# Patient Record
Sex: Female | Born: 1980 | Race: Black or African American | Hispanic: No | Marital: Married | Smoking: Current every day smoker
Health system: Southern US, Community
[De-identification: ages and names within clinical notes are randomized; demographics above are authoritative.]

## PROBLEM LIST (undated history)

## (undated) ENCOUNTER — Emergency Department (HOSPITAL_COMMUNITY): Admission: EM | Payer: Self-pay | Source: Home / Self Care

## (undated) DIAGNOSIS — N938 Other specified abnormal uterine and vaginal bleeding: Secondary | ICD-10-CM

## (undated) DIAGNOSIS — A749 Chlamydial infection, unspecified: Secondary | ICD-10-CM

## (undated) DIAGNOSIS — E669 Obesity, unspecified: Secondary | ICD-10-CM

## (undated) DIAGNOSIS — D219 Benign neoplasm of connective and other soft tissue, unspecified: Secondary | ICD-10-CM

## (undated) DIAGNOSIS — A549 Gonococcal infection, unspecified: Secondary | ICD-10-CM

## (undated) DIAGNOSIS — Q810 Epidermolysis bullosa simplex: Secondary | ICD-10-CM

## (undated) DIAGNOSIS — Z789 Other specified health status: Secondary | ICD-10-CM

## (undated) HISTORY — DX: Epidermolysis bullosa simplex: Q81.0

## (undated) HISTORY — PX: TONSILLECTOMY: SUR1361

## (undated) HISTORY — DX: Chlamydial infection, unspecified: A74.9

## (undated) HISTORY — DX: Obesity, unspecified: E66.9

## (undated) HISTORY — DX: Benign neoplasm of connective and other soft tissue, unspecified: D21.9

## (undated) HISTORY — DX: Other specified abnormal uterine and vaginal bleeding: N93.8

## (undated) HISTORY — DX: Gonococcal infection, unspecified: A54.9

---

## 2007-05-07 ENCOUNTER — Emergency Department (HOSPITAL_COMMUNITY): Admission: EM | Admit: 2007-05-07 | Discharge: 2007-05-07 | Payer: Self-pay | Admitting: Emergency Medicine

## 2007-06-21 ENCOUNTER — Emergency Department (HOSPITAL_COMMUNITY): Admission: EM | Admit: 2007-06-21 | Discharge: 2007-06-21 | Payer: Self-pay | Admitting: Emergency Medicine

## 2007-10-23 ENCOUNTER — Emergency Department (HOSPITAL_COMMUNITY): Admission: EM | Admit: 2007-10-23 | Discharge: 2007-10-23 | Payer: Self-pay | Admitting: Emergency Medicine

## 2008-10-17 ENCOUNTER — Inpatient Hospital Stay (HOSPITAL_COMMUNITY): Admission: AD | Admit: 2008-10-17 | Discharge: 2008-10-17 | Payer: Self-pay | Admitting: Obstetrics & Gynecology

## 2009-11-05 IMAGING — CT CT PELVIS W/O CM
1 of 2 series · 15 of 32 positions shown, 19 images · non-contrast
Comparison: None.

CLINICAL DATA: Right flank and groin pain. 
 ABDOMEN CT WITHOUT CONTRAST (STONE PROTOCOL):
TECHNIQUE: Multidetector CT imaging of the abdomen was performed following the standard protocol without oral or IV contrast.
TECHNIQUE: Multidetector CT imaging of the pelvis was performed following the standard protocol without oral or IV contrast.

[Series 2: 220 stone 5.0 b40f st · axial · 0.63mm/px · z∈[-420,-44]mm · 15 of 102 slices shown, 19 images]
[im 4/102  soft-tissue]
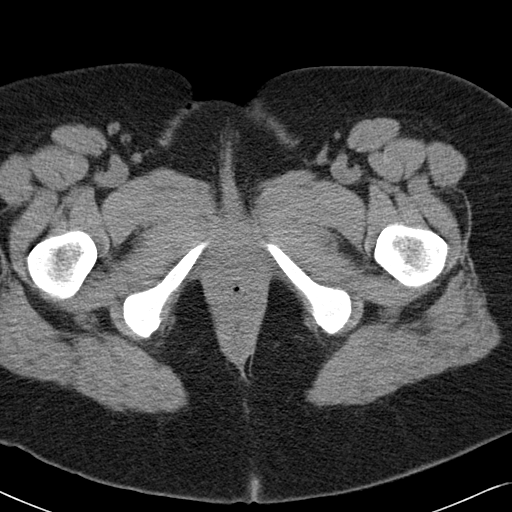
[im 4/102  bone]
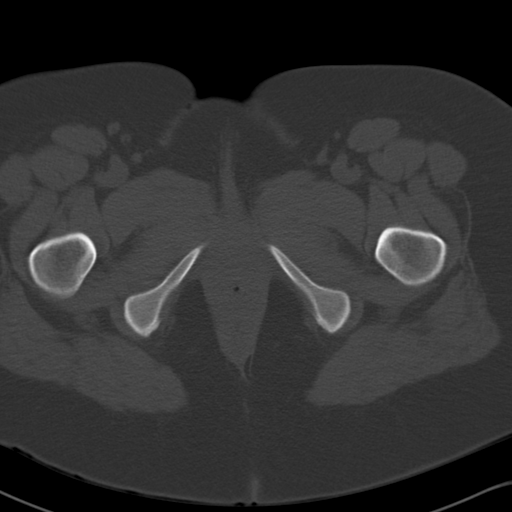
[im 12/102  soft-tissue]
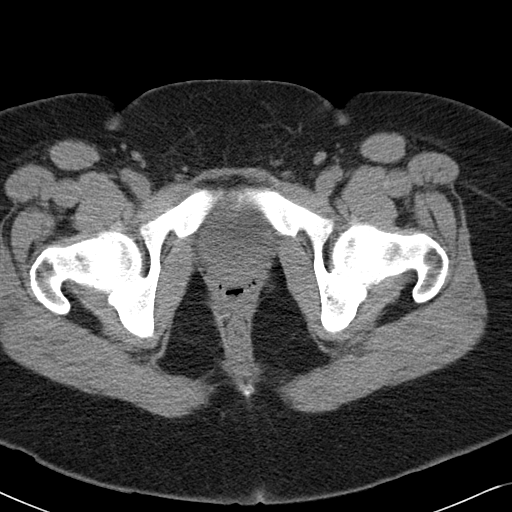
[im 20/102  soft-tissue]
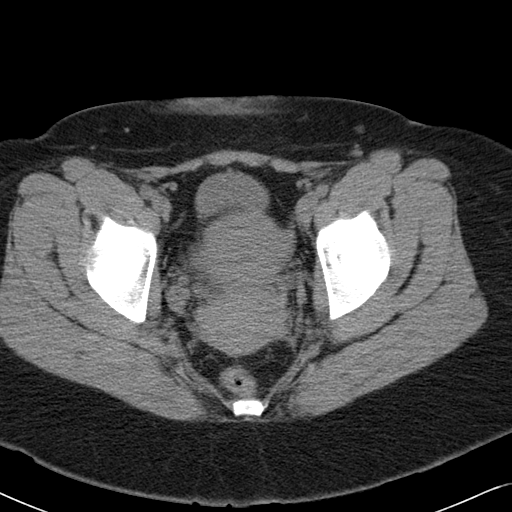
[im 28/102  soft-tissue]
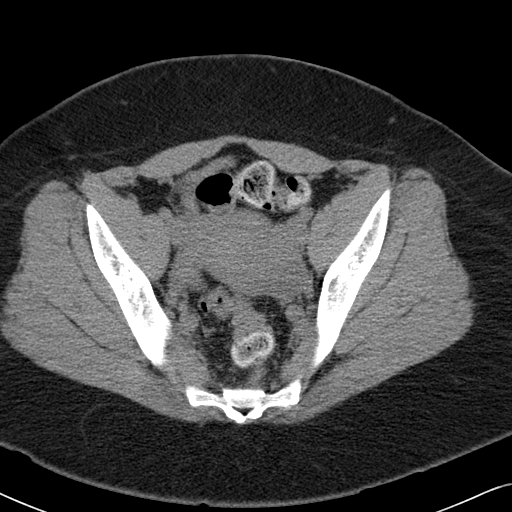
[im 35/102  soft-tissue]
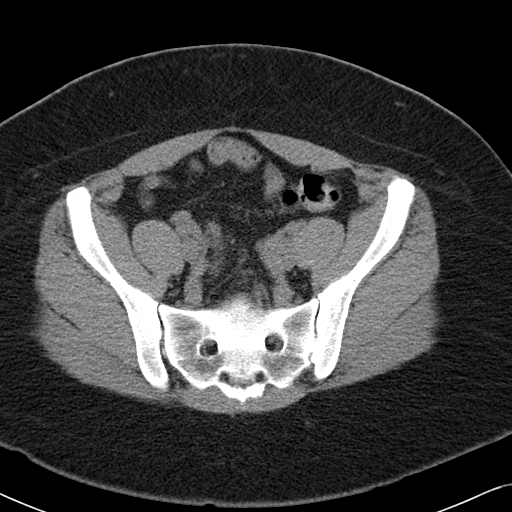
[im 43/102  soft-tissue]
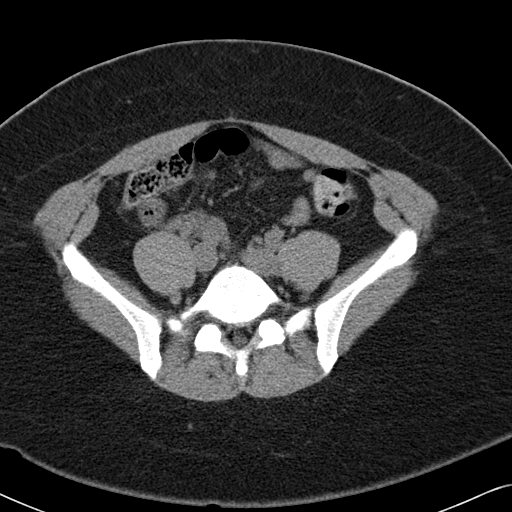
[im 51/102  soft-tissue]
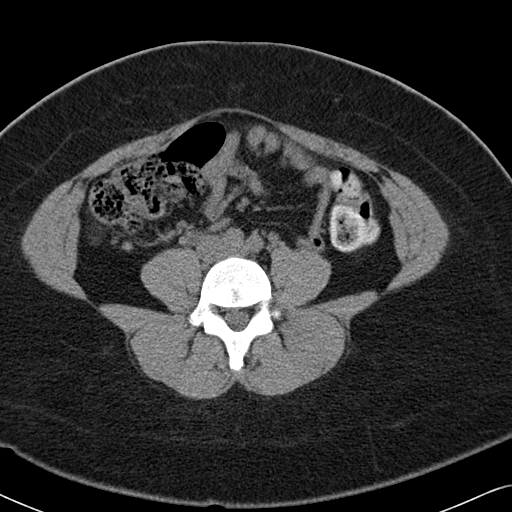
[im 59/102  soft-tissue]
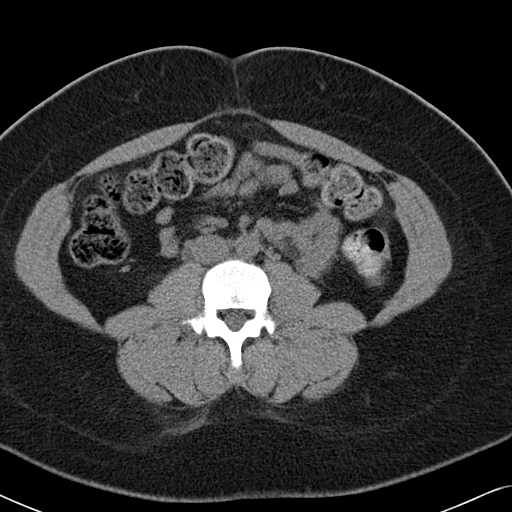
[im 67/102  soft-tissue]
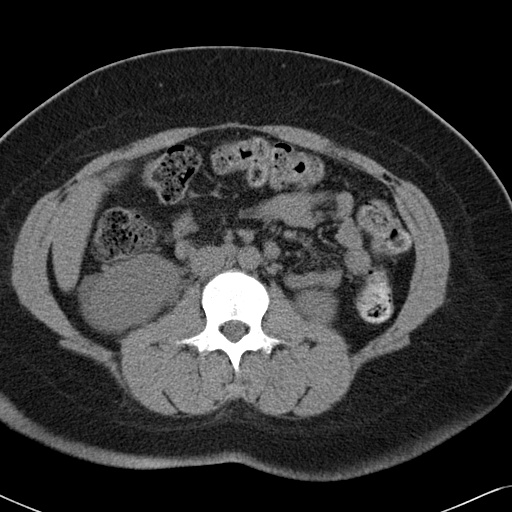
[im 67/102  bone]
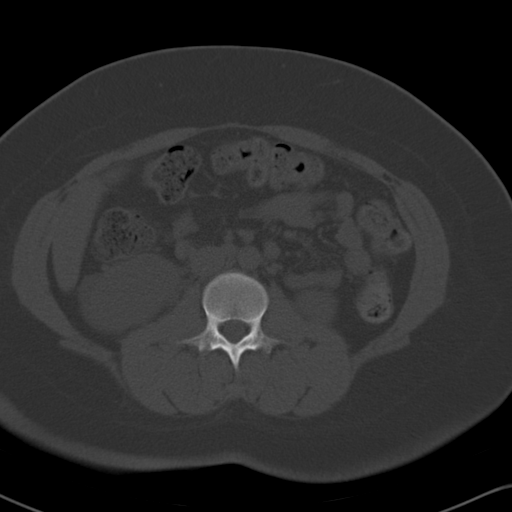
[im 74/102  soft-tissue]
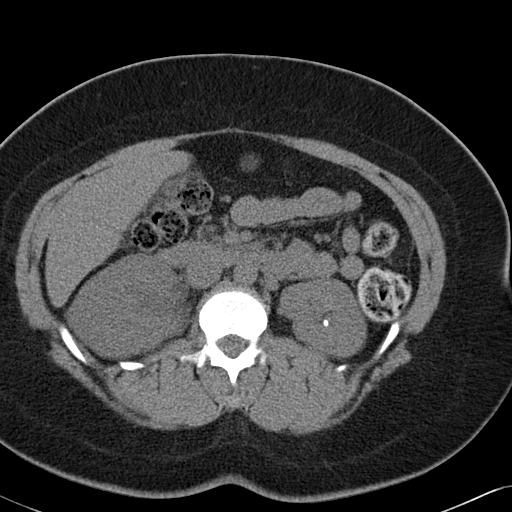
[im 82/102  soft-tissue]
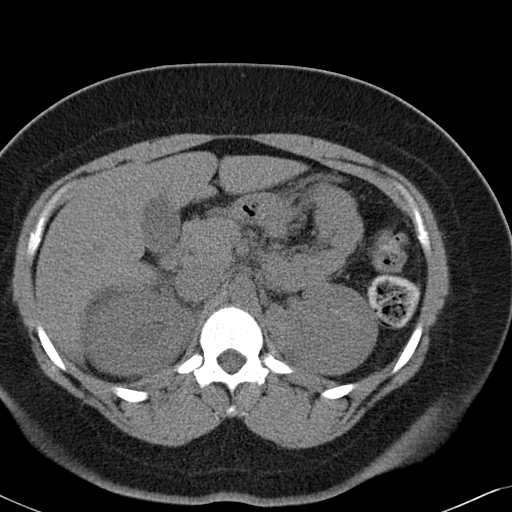
[im 86/102  lung]
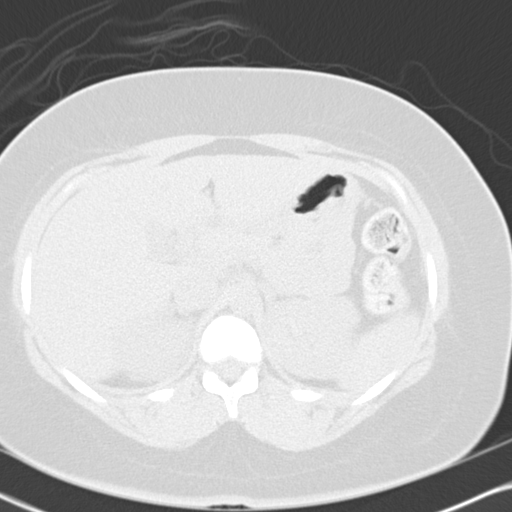
[im 90/102  soft-tissue]
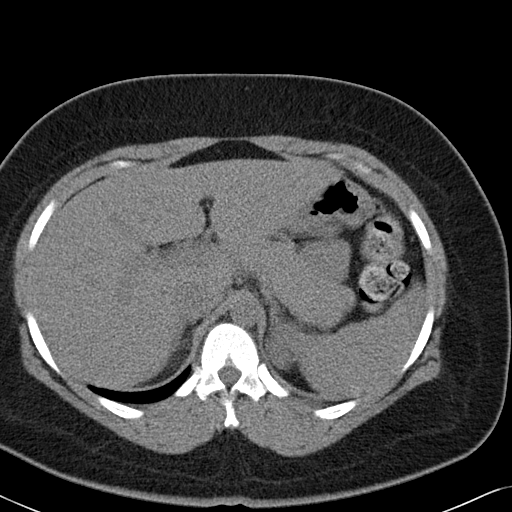
[im 90/102  lung]
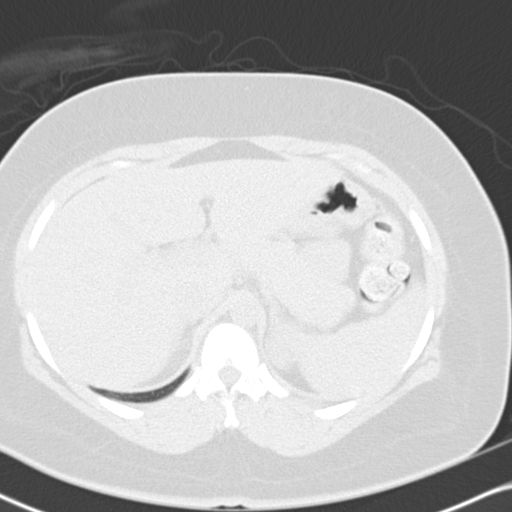
[im 94/102  lung]
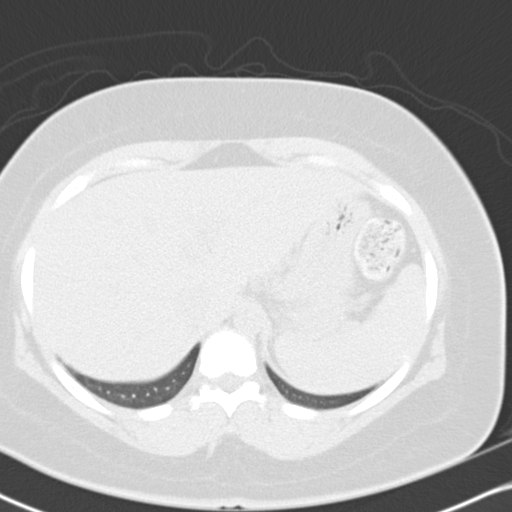
[im 98/102  soft-tissue]
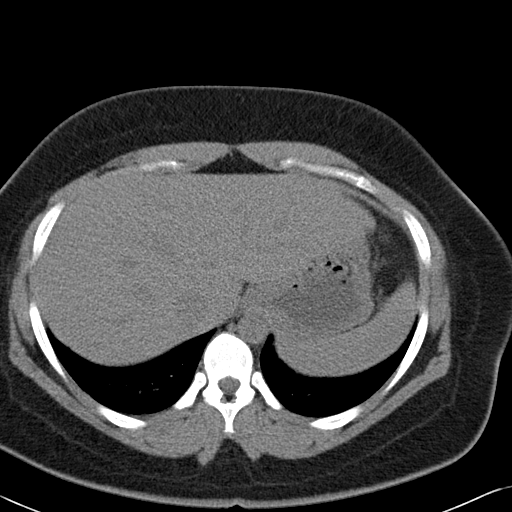
[im 98/102  lung]
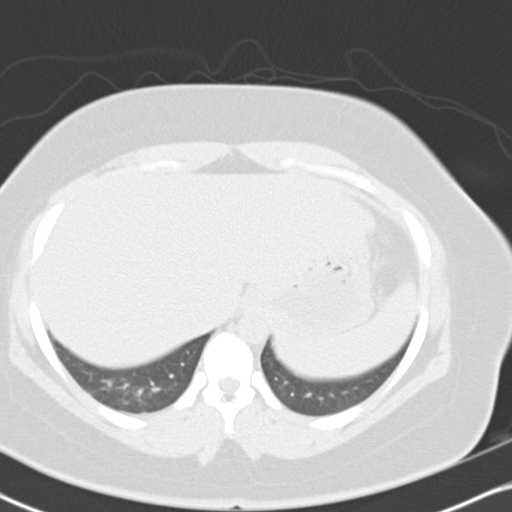

[15 of 32 positions shown; findings below may reference images not displayed]

FINDINGS: There are several left renal calculi.  The largest is in the lower pole measuring about 4 mm.  There are no definite right renal calculi, but there does appear to be some vague central renal collecting system fullness, and there is some slight stranding around the right kidney suggesting that there is an acute obstruction. 
 The right proximal ureter is mildly dilated. 
 Other viscera are unremarkable given the limitations of scanning without oral or IV contrast.  No calcified gallstones. 
 The appendix is markedly elongated, extending upward from the cecum to the level of the right mid kidney.  It is no inflamed.
IMPRESSION: 1.  Left nephrolithiasis.  
 2.  Probable acute right urinary tract obstruction. 
 PELVIS CT WITHOUT CONTRAST (STONE PROTOCOL):
FINDINGS: The right ureter is difficult to follow all the way down to the bladder.  On image #77, there is a probable 4 x 4 x 2.6 mm calculus.  It is possible that this is extra-ureteral.  If symptoms persist, one might consider obtaining a contrast-enhanced CT to document the position of the right ureter. 
 There is some calcification in the central pelvis that is in the uterus.  This is most consistent with a uterine fibroid. 
 No other findings of significance.
IMPRESSION: 1.  Probable 4 x 4 x 2.6 mm right distal ureteral calculus with mild urinary tract obstruction.  See report. 
 2.  Calcified uterine fibroid.

## 2010-02-03 ENCOUNTER — Emergency Department (HOSPITAL_COMMUNITY)
Admission: EM | Admit: 2010-02-03 | Discharge: 2010-02-04 | Payer: Self-pay | Source: Home / Self Care | Admitting: Emergency Medicine

## 2010-06-18 ENCOUNTER — Encounter: Payer: Self-pay | Admitting: Obstetrics & Gynecology

## 2010-08-09 LAB — URINALYSIS, ROUTINE W REFLEX MICROSCOPIC
Bilirubin Urine: NEGATIVE
Ketones, ur: NEGATIVE mg/dL
Protein, ur: NEGATIVE mg/dL
Urobilinogen, UA: 0.2 mg/dL (ref 0.0–1.0)

## 2010-08-09 LAB — POCT PREGNANCY, URINE: Preg Test, Ur: NEGATIVE

## 2010-08-09 LAB — URINE MICROSCOPIC-ADD ON

## 2010-09-04 LAB — CBC
HCT: 34.5 % — ABNORMAL LOW (ref 36.0–46.0)
MCHC: 33.2 g/dL (ref 30.0–36.0)
RBC: 4.15 MIL/uL (ref 3.87–5.11)
RDW: 15.7 % — ABNORMAL HIGH (ref 11.5–15.5)

## 2010-09-04 LAB — URINE CULTURE: Colony Count: 60000

## 2010-09-04 LAB — URINALYSIS, ROUTINE W REFLEX MICROSCOPIC
Specific Gravity, Urine: >1.03 — ABNORMAL HIGH (ref 1.005–1.030)
Urobilinogen, UA: 0.2 mg/dL (ref 0.0–1.0)

## 2010-09-04 LAB — URINE MICROSCOPIC-ADD ON

## 2010-11-12 ENCOUNTER — Emergency Department (HOSPITAL_COMMUNITY)
Admission: EM | Admit: 2010-11-12 | Discharge: 2010-11-12 | Disposition: A | Discharge status: Home / Self Care | Payer: Self-pay | Attending: Emergency Medicine | Admitting: Emergency Medicine

## 2010-11-12 DIAGNOSIS — R6889 Other general symptoms and signs: Secondary | ICD-10-CM | POA: Insufficient documentation

## 2010-11-12 DIAGNOSIS — R21 Rash and other nonspecific skin eruption: Secondary | ICD-10-CM | POA: Insufficient documentation

## 2010-11-12 LAB — RAPID STREP SCREEN (MED CTR MEBANE ONLY): Streptococcus, Group A Screen (Direct): NEGATIVE

## 2011-02-14 LAB — URINALYSIS, ROUTINE W REFLEX MICROSCOPIC
Bilirubin Urine: NEGATIVE
Glucose, UA: NEGATIVE
Urobilinogen, UA: 1

## 2011-03-04 LAB — URINE MICROSCOPIC-ADD ON

## 2011-03-04 LAB — URINALYSIS, ROUTINE W REFLEX MICROSCOPIC
Bilirubin Urine: NEGATIVE
Glucose, UA: NEGATIVE
Ketones, ur: NEGATIVE
Nitrite: NEGATIVE
Protein, ur: 30 — AB
Specific Gravity, Urine: 1.026
pH: 7.5

## 2011-05-18 ENCOUNTER — Encounter (HOSPITAL_COMMUNITY): Payer: Self-pay | Admitting: *Deleted

## 2011-05-18 ENCOUNTER — Inpatient Hospital Stay (HOSPITAL_COMMUNITY)
Admission: AD | Admit: 2011-05-18 | Discharge: 2011-05-18 | Disposition: A | Discharge status: Home / Self Care | Payer: Self-pay | Source: Ambulatory Visit | Attending: Obstetrics and Gynecology | Admitting: Obstetrics and Gynecology

## 2011-05-18 ENCOUNTER — Emergency Department (HOSPITAL_COMMUNITY)
Admission: EM | Admit: 2011-05-18 | Discharge: 2011-05-18 | Disposition: A | Discharge status: Home / Self Care | Payer: Self-pay | Attending: Emergency Medicine | Admitting: Emergency Medicine

## 2011-05-18 DIAGNOSIS — L0231 Cutaneous abscess of buttock: Secondary | ICD-10-CM | POA: Insufficient documentation

## 2011-05-18 DIAGNOSIS — F17200 Nicotine dependence, unspecified, uncomplicated: Secondary | ICD-10-CM | POA: Insufficient documentation

## 2011-05-18 DIAGNOSIS — K6289 Other specified diseases of anus and rectum: Secondary | ICD-10-CM | POA: Insufficient documentation

## 2011-05-18 DIAGNOSIS — K612 Anorectal abscess: Secondary | ICD-10-CM | POA: Insufficient documentation

## 2011-05-18 DIAGNOSIS — K611 Rectal abscess: Secondary | ICD-10-CM

## 2011-05-18 MED ORDER — LIDOCAINE HCL 2 % IJ SOLN
INTRAMUSCULAR | Status: AC
  Administered 2011-05-18: 14:00:00
  Filled 2011-05-18: qty 1

## 2011-05-18 MED ORDER — OXYCODONE-ACETAMINOPHEN 5-325 MG PO TABS
1.0000 | ORAL_TABLET | Freq: Once | ORAL | Status: AC
  Administered 2011-05-18: 1 via ORAL
  Filled 2011-05-18: qty 1

## 2011-05-18 MED ORDER — OXYCODONE-ACETAMINOPHEN 5-325 MG PO TABS: 1.0000 | ORAL_TABLET | ORAL | Status: AC | PRN

## 2011-05-18 NOTE — ED Notes (Signed)
PA at bedside.

## 2011-05-18 NOTE — ED Provider Notes (Signed)
History     CSN: [id number]  Arrival date & time 05/18/11  7829   None     Chief Complaint  Patient presents with  . Cyst    HPI April Wolfe is a 30 y.o. female who presents to [facility] for abscess on buttocks.  Pain and swelling began 4 days ago and patient began sitting in tubs of warm water and using warm wet compresses on the area in hopes that it would drain. However, the area has increased in size and is more painful. Tried boil-ease without relief.   History reviewed. No pertinent past medical history.  Past Surgical History  Procedure Date  . Tonsillectomy     No family history on file.  History  Substance Use Topics  . Smoking status: Current Everyday Smoker -- 0.5 packs/day  . Smokeless tobacco: Not on file  . Alcohol Use: No    OB History    Grav Para Term Preterm Abortions TAB SAB Ect Mult Living   3 2 1 1 1  0 1 0 0 2      Review of Systems  Constitutional: Negative for fever and chills.  HENT: Negative.   Eyes: Negative.   Respiratory: Negative.   Cardiovascular: Negative.   Gastrointestinal: Negative for nausea, vomiting, abdominal pain, diarrhea and constipation.  Genitourinary: Positive for frequency. Negative for dysuria and pelvic pain.  Skin:       Tender swollen area right buttock.  Psychiatric/Behavioral: Negative for confusion. The patient is not nervous/anxious.     Allergies  Review of patient's allergies indicates not on file.  Home Medications  No current outpatient prescriptions on file.  BP 126/65  Pulse 85  Temp(Src) 98.4 F (36.9 C) (Oral)  Resp 18  Ht 5\' 3"  (1.6 m)  Wt 222 lb (100.699 kg)  BMI 39.33 kg/m2  LMP 05/13/2011  Physical Exam  Nursing note and vitals reviewed. Constitutional: She is oriented to person, place, and time. She appears well-developed and well-nourished.  HENT:  Head: Normocephalic.  Eyes: EOM are normal.  Neck: Neck supple.  Cardiovascular: Normal rate.   Pulmonary/Chest: Effort normal.    Abdominal: Soft. There is no tenderness.  Genitourinary:       Tender, raised red area right buttock extending to the rectal sphincter.   Musculoskeletal: Normal range of motion.  Neurological: She is alert and oriented to person, place, and time. No cranial nerve deficit.  Skin: Skin is warm and dry.  Psychiatric: She has a normal mood and affect. Her behavior is normal. Judgment and thought content normal.   Assessment: Perirectal abscess  Plan:  Consult with Dr. Biagio Quint and he will evaluate the patient in the [facility] ED.   Patient will be discharged from [facility] and drive herself to [facility]  ED Course  Procedures   MDM          Kerrie Buffalo, NP 05/18/11 [phone]

## 2011-05-18 NOTE — Progress Notes (Signed)
Pt has boil on her  Right rectal peri area Started 4-5 days ago. Has been soaking in warm water with out releif.

## 2011-05-18 NOTE — ED Notes (Signed)
Rectal abscess for about 4-5 days, red tender and painful

## 2011-05-18 NOTE — ED Provider Notes (Signed)
History     CSN: [id number]  Arrival date & time 05/18/11  1045   First MD Initiated Contact with Patient 05/18/11 1147      Chief Complaint  Patient presents with  . Rectal Pain    (Consider location/radiation/quality/duration/timing/severity/associated sxs/prior treatment) Patient is a 30 y.o. female presenting with abscess. The history is provided by the patient.  Abscess  The problem occurs continuously. The problem has been gradually worsening. Affected Location: Location is on right buttock adjacent to anus. The problem is moderate. The abscess is characterized by painfulness and swelling. Pertinent negatives include no fever. Associated symptoms comments: She attempted a bowel movement earlier today that was too painful to complete.Marland Kitchen    History reviewed. No pertinent past medical history.  Past Surgical History  Procedure Date  . Tonsillectomy     No family history on file.  History  Substance Use Topics  . Smoking status: Current Everyday Smoker -- 0.5 packs/day  . Smokeless tobacco: Not on file  . Alcohol Use: No    OB History    Grav Para Term Preterm Abortions TAB SAB Ect Mult Living   3 2 1 1 1  0 1 0 0 2      Review of Systems  Constitutional: Negative for fever and chills.  HENT: Negative.   Respiratory: Negative.   Cardiovascular: Negative.   Gastrointestinal: Negative.  Negative for nausea.  Musculoskeletal: Negative.   Skin:       C/O Abscess.  Neurological: Negative.     Allergies  Review of patient's allergies indicates no known allergies.  Home Medications  No current outpatient prescriptions on file.  BP 126/83  Pulse 84  Temp(Src) 97.6 F (36.4 C) (Oral)  Resp 18  SpO2 98%  LMP 05/13/2011  Physical Exam  Constitutional: She is oriented to person, place, and time. She appears well-developed and well-nourished.  Neck: Normal range of motion.  Pulmonary/Chest: Effort normal.  Genitourinary:       2cmx2cm fluctuant area of  induration inferior to anus. No active drainage.   Neurological: She is alert and oriented to person, place, and time.  Skin: Skin is warm and dry.    ED Course  Procedures (including critical care time)  Labs Reviewed - No data to display No results found.   No diagnosis found.    MDM   INCISION AND DRAINAGE Performed by: Langley Adie A Consent: Verbal consent obtained. Risks and benefits: risks, benefits and alternatives were discussed Type: abscess  Body area: perirectal  Anesthesia: local infiltration  Local anesthetic: lidocaine 1% w/o epinephrine  Anesthetic total: 2 ml  Complexity: complex Blunt dissection to break up loculations  Drainage: purulent  Drainage amount: moderate  Packing material: 1/4 in iodoform gauze  Patient tolerance: Patient tolerated the procedure well with no immediate complications.     Rodena Medin, PA 05/18/11 [phone]

## 2011-05-19 NOTE — ED Provider Notes (Signed)
Agree with above note.  April Wolfe 05/19/2011 7:41 AM

## 2011-05-19 NOTE — ED Provider Notes (Signed)
Medical screening examination/treatment/procedure(s) were conducted as a shared visit with non-physician practitioner(s) and myself.  I personally evaluated the patient during the encounter  2x2cm Perirectal Abscess s/p I&D. Will f/u in 2 days for recheck  Forbes Cellar, MD 05/19/11 [phone]

## 2011-05-20 ENCOUNTER — Encounter (HOSPITAL_COMMUNITY): Payer: Self-pay | Admitting: Family Medicine

## 2011-05-20 ENCOUNTER — Emergency Department (HOSPITAL_COMMUNITY)
Admission: EM | Admit: 2011-05-20 | Discharge: 2011-05-20 | Disposition: A | Discharge status: Home / Self Care | Payer: Self-pay | Attending: Emergency Medicine | Admitting: Emergency Medicine

## 2011-05-20 DIAGNOSIS — L02212 Cutaneous abscess of back [any part, except buttock]: Secondary | ICD-10-CM

## 2011-05-20 DIAGNOSIS — L02219 Cutaneous abscess of trunk, unspecified: Secondary | ICD-10-CM | POA: Insufficient documentation

## 2011-05-20 DIAGNOSIS — Z09 Encounter for follow-up examination after completed treatment for conditions other than malignant neoplasm: Secondary | ICD-10-CM | POA: Insufficient documentation

## 2011-05-20 DIAGNOSIS — L03319 Cellulitis of trunk, unspecified: Secondary | ICD-10-CM | POA: Insufficient documentation

## 2011-05-20 DIAGNOSIS — F17200 Nicotine dependence, unspecified, uncomplicated: Secondary | ICD-10-CM | POA: Insufficient documentation

## 2011-05-20 MED ORDER — DOXYCYCLINE HYCLATE 100 MG PO CAPS: 100.0000 mg | ORAL_CAPSULE | Freq: Two times a day (BID) | ORAL | Status: AC

## 2011-05-20 NOTE — ED Notes (Signed)
Pt reports she had a boil lanced and packed here on x3 days ago located on buttocks. States she was told to come back for a re-check.

## 2011-05-20 NOTE — ED Provider Notes (Signed)
History     CSN: [id number]  Arrival date & time 05/20/11  1745   First MD Initiated Contact with Patient 05/20/11 1826      Chief Complaint  Patient presents with  . Abscess    follow up    (Consider location/radiation/quality/duration/timing/severity/associated sxs/prior treatment) Patient is a 30 y.o. female presenting with abscess. The history is provided by the patient.  Abscess  This is a new problem. The current episode started less than one week ago. The onset was sudden. The problem has been gradually improving. The abscess is present on the right buttock. The problem is mild. The abscess is characterized by painfulness, draining and swelling. Pertinent negatives include no fever.  Pt with right buttocks abscess near the rectum. Had it drained 2 days ago, here for recheck. States pain is improving, abscess still draining. No fever, chills, malaise.   History reviewed. No pertinent past medical history.  Past Surgical History  Procedure Date  . Tonsillectomy     History reviewed. No pertinent family history.  History  Substance Use Topics  . Smoking status: Current Everyday Smoker -- 0.5 packs/day  . Smokeless tobacco: Not on file  . Alcohol Use: No    OB History    Grav Para Term Preterm Abortions TAB SAB Ect Mult Living   3 2 1 1 1  0 1 0 0 2      Review of Systems  Constitutional: Negative for fever and chills.  HENT: Negative.   Eyes: Negative.   Respiratory: Negative.   Cardiovascular: Negative.   Gastrointestinal: Negative.   Musculoskeletal: Negative.   Skin:       abscess  Neurological: Negative.   Psychiatric/Behavioral: Negative.     Allergies  Review of patient's allergies indicates no known allergies.  Home Medications   Current Outpatient Rx  Name Route Sig Dispense Refill  . OXYCODONE-ACETAMINOPHEN 5-325 MG PO TABS Oral Take 1 tablet by mouth every 4 (four) hours as needed for pain. 15 tablet 0    BP 133/78  Pulse 78   Temp(Src) 99.2 F (37.3 C) (Oral)  Resp 18  SpO2 98%  LMP 05/13/2011  Physical Exam  Constitutional: She is oriented to person, place, and time. She appears well-developed and well-nourished.  HENT:  Head: Normocephalic.  Eyes: Pupils are equal, round, and reactive to light.  Neck: Neck supple.  Cardiovascular: Normal rate, regular rhythm and normal heart sounds.   Pulmonary/Chest: Effort normal and breath sounds normal. No respiratory distress.  Neurological: She is alert and oriented to person, place, and time.  Skin: Skin is warm and dry.       I&ded abscess tot he left pararectal area. Draining. Malodorous. Packing out. Tender to palpation. No induration palpated.  Psychiatric: She has a normal mood and affect.    ED Course  Procedures (including critical care time)  Abscess rechecked. Now soft. Packing is out on its own. Will start on antibiotics. D/c home with sitz baths, and follow up.  MDM          Lottie Mussel, PA 05/20/11 [phone]

## 2011-05-20 NOTE — ED Provider Notes (Signed)
Medical screening examination/treatment/procedure(s) were performed by non-physician practitioner and as supervising physician I was immediately available for consultation/collaboration.   Dayton Bailiff, MD 05/20/11 [phone]

## 2011-07-31 ENCOUNTER — Inpatient Hospital Stay (HOSPITAL_COMMUNITY): Payer: Self-pay

## 2011-07-31 ENCOUNTER — Inpatient Hospital Stay (HOSPITAL_COMMUNITY)
Admission: AD | Admit: 2011-07-31 | Discharge: 2011-07-31 | Disposition: A | Discharge status: Home / Self Care | Payer: Self-pay | Source: Ambulatory Visit | Attending: Obstetrics & Gynecology | Admitting: Obstetrics & Gynecology

## 2011-07-31 ENCOUNTER — Encounter (HOSPITAL_COMMUNITY): Payer: Self-pay | Admitting: *Deleted

## 2011-07-31 DIAGNOSIS — N949 Unspecified condition associated with female genital organs and menstrual cycle: Secondary | ICD-10-CM | POA: Insufficient documentation

## 2011-07-31 DIAGNOSIS — D259 Leiomyoma of uterus, unspecified: Secondary | ICD-10-CM | POA: Insufficient documentation

## 2011-07-31 DIAGNOSIS — R1032 Left lower quadrant pain: Secondary | ICD-10-CM | POA: Insufficient documentation

## 2011-07-31 HISTORY — DX: Other specified health status: Z78.9

## 2011-07-31 LAB — COMPREHENSIVE METABOLIC PANEL
AST: 12 U/L (ref 0–37)
Albumin: 3.5 g/dL (ref 3.5–5.2)
BUN: 11 mg/dL (ref 6–23)
Calcium: 9.7 mg/dL (ref 8.4–10.5)
Creatinine, Ser: 0.8 mg/dL (ref 0.50–1.10)

## 2011-07-31 LAB — CBC
MCH: 22.8 pg — ABNORMAL LOW (ref 26.0–34.0)
MCHC: 29.9 g/dL — ABNORMAL LOW (ref 30.0–36.0)
MCV: 76.5 fL — ABNORMAL LOW (ref 78.0–100.0)
Platelets: 569 10*3/uL — ABNORMAL HIGH (ref 150–400)
RBC: 4.29 MIL/uL (ref 3.87–5.11)
RDW: 16 % — ABNORMAL HIGH (ref 11.5–15.5)

## 2011-07-31 LAB — HCG, SERUM, QUALITATIVE: Preg, Serum: NEGATIVE

## 2011-07-31 MED ORDER — OXYCODONE-ACETAMINOPHEN 5-325 MG PO TABS: 1.0000 | ORAL_TABLET | ORAL | Status: AC | PRN

## 2011-07-31 MED ORDER — KETOROLAC TROMETHAMINE 30 MG/ML IJ SOLN
60.0000 mg | Freq: Once | INTRAMUSCULAR | Status: AC
  Administered 2011-07-31: 60 mg via INTRAVENOUS

## 2011-07-31 MED ORDER — LACTATED RINGERS IV BOLUS (SEPSIS)
1000.0000 mL | Freq: Once | INTRAVENOUS | Status: AC
  Administered 2011-07-31: 1000 mL via INTRAVENOUS

## 2011-07-31 MED ORDER — PROMETHAZINE HCL 25 MG/ML IJ SOLN
25.0000 mg | Freq: Four times a day (QID) | INTRAMUSCULAR | Status: DC | PRN
  Filled 2011-07-31: qty 1

## 2011-07-31 MED ORDER — NAPROXEN 375 MG PO TABS: 375.0000 mg | ORAL_TABLET | Freq: Two times a day (BID) | ORAL | Status: DC

## 2011-07-31 MED ORDER — KETOROLAC TROMETHAMINE 60 MG/2ML IM SOLN
60.0000 mg | Freq: Once | INTRAMUSCULAR | Status: DC
  Filled 2011-07-31: qty 2

## 2011-07-31 NOTE — Discharge Instructions (Signed)
Fibroids You have been diagnosed as having a fibroid. Fibroids are smooth muscle lumps (tumors) which can occur any place in a woman's body. They are usually in the womb (uterus). The most common problem (symptom) of fibroids is bleeding. Over time this may cause low red blood cells (anemia). Other symptoms include feelings of pressure and pain in the pelvis. The diagnosis (learning what is wrong) of fibroids is made by physical exam. Sometimes tests such as an ultrasound are used. This is helpful when fibroids are felt around the ovaries and to look for tumors. TREATMENT   Most fibroids do not need surgical or medical treatment. Sometimes a tissue sample (biopsy) of the lining of the uterus is done to rule out cancer. If there is no cancer and only a small amount of bleeding, the problem can be watched.   Hormonal treatment can improve the problem.   When surgery is needed, it can consist of removing the fibroid. Vaginal birth may not be possible after the removal of fibroids. This depends on where they are and the extent of surgery. When pregnancy occurs with fibroids it is usually normal.   Your caregiver can help decide which treatments are best for you.  HOME CARE INSTRUCTIONS   Do not use aspirin as this may increase bleeding problems.   If your periods (menses) are heavy, record the number of pads or tampons used per month. Bring this information to your caregiver. This can help them determine the best treatment for you.  SEEK IMMEDIATE MEDICAL CARE IF:  You have pelvic pain or cramps not controlled with medications, or experience a sudden increase in pain.   You have an increase of pelvic bleeding between and during menses.   You feel lightheaded or have fainting spells.   You develop worsening belly (abdominal) pain.  Document Released: 05/10/2000 Document Revised: 05/02/2011 Document Reviewed: 12/31/2007 [employer] Patient Information 2012 [employer].    

## 2011-07-31 NOTE — ED Provider Notes (Signed)
History     Chief Complaint  Patient presents with  . Pelvic Pain   HPI 31 yo F presenting for acute onset severe left lower quadrant abdominal pain. Patient starting having pain after 2:00pm in lower left pelvic radiates to back. Never had anything like this before. Sharp pains, 10/10. Position makes it better. Bumps in road made it worse.  LMP early February. No contraception.  No bleeding, no vaginal discharge, no pain with sex, no dysuria.  Normally gets period every 28-30 days, heavy bleeding. Painful cramps but this pain does not feel like normal cramps. G3, [id number]. Miscarriage at 4 months. 2nd living child at 32 weeks. Both vaginal births.  OB History    Grav Para Term Preterm Abortions TAB SAB Ect Mult Living   3 2 1 1 1  0 1 0 0 2      Past Medical History  Diagnosis Date  . No pertinent past medical history     Past Surgical History  Procedure Date  . Tonsillectomy     Family History  Problem Relation Age of Onset  . Hypertension Father     History  Substance Use Topics  . Smoking status: Current Everyday Smoker -- 0.5 packs/day  . Smokeless tobacco: Not on file  . Alcohol Use: 0.5 oz/week    1 drink(s) per week    Allergies: No Known Allergies  No prescriptions prior to admission    Review of Systems  Constitutional: Negative for fever.  Respiratory: Negative for shortness of breath.   Cardiovascular: Negative for chest pain.  Gastrointestinal: Positive for nausea, vomiting and abdominal pain.  Genitourinary: Negative for dysuria.  Neurological: Negative for headaches.   Physical Exam   Blood pressure 126/75, pulse 81, temperature 98.3 F (36.8 C), temperature source Oral, resp. rate 18, SpO2 98.00%.  Physical Exam  Constitutional: She is oriented to person, place, and time. She appears well-developed and well-nourished. She appears distressed.  HENT:  Head: Normocephalic and atraumatic.  Neck: Normal range of motion.  Cardiovascular: Normal  rate, regular rhythm and normal heart sounds.   Respiratory: Effort normal and breath sounds normal.  GI: There is tenderness. There is rebound.       Obese. No masses palpated. Focal tenderness in distal left lower quadrant. Patient does have rebound tenderness present.   Musculoskeletal: Normal range of motion. She exhibits no edema.  Lymphadenopathy:    She has no cervical adenopathy.  Neurological: She is alert and oriented to person, place, and time.    [facility] Course  Procedures Results for orders placed during the hospital encounter of 07/31/11 (from the past 24 hour(s))  CBC     Status: Abnormal   Collection Time   07/31/11  7:13 PM      Component Value Range   WBC 12.9 (*) 4.0 - 10.5 (K/uL)   RBC 4.29  3.87 - 5.11 (MIL/uL)   Hemoglobin 9.8 (*) 12.0 - 15.0 (g/dL)   HCT 16.1 (*) 09.6 - 46.0 (%)   MCV 76.5 (*) 78.0 - 100.0 (fL)   MCH 22.8 (*) 26.0 - 34.0 (pg)   MCHC 29.9 (*) 30.0 - 36.0 (g/dL)   RDW 04.5 (*) 40.9 - 15.5 (%)   Platelets 569 (*) 150 - 400 (K/uL)  HCG, SERUM, QUALITATIVE     Status: Normal   Collection Time   07/31/11  7:13 PM      Component Value Range   Preg, Serum NEGATIVE  NEGATIVE   COMPREHENSIVE METABOLIC PANEL  Status: Abnormal   Collection Time   07/31/11  7:13 PM      Component Value Range   Sodium 136  135 - 145 (mEq/L)   Potassium 3.9  3.5 - 5.1 (mEq/L)   Chloride 102  96 - 112 (mEq/L)   CO2 23  19 - 32 (mEq/L)   Glucose, Bld 104 (*) 70 - 99 (mg/dL)   BUN 11  6 - 23 (mg/dL)   Creatinine, Ser 1.61  0.50 - 1.10 (mg/dL)   Calcium 9.7  8.4 - 09.6 (mg/dL)   Total Protein 7.4  6.0 - 8.3 (g/dL)   Albumin 3.5  3.5 - 5.2 (g/dL)   AST 12  0 - 37 (U/L)   ALT 14  0 - 35 (U/L)   Alkaline Phosphatase 104  39 - 117 (U/L)   Total Bilirubin 0.1 (*) 0.3 - 1.2 (mg/dL)   GFR calc non Af Amer >90  >90 (mL/min)   GFR calc Af Amer >90  >90 (mL/min)    MDM Patient clearly uncomfortable. Will start LR bolus, check upreg/serum qualitative BHCG, CBC, Cmet, non-OB  ultrasound. Will give Toradol 60mg  x1. Reassess pain. Plan discussed with Georges Mouse, CNM  Assessment and Plan  31 yo F presenting for acute onset severe left lower quadrant abdominal pain - Pregnancy test negative - US showed multiple fibroids, largest measuring 9.3x7.3x9.1cm. This is the most likely cause of her pain.  - Toradol helped her pain and patient appeared much more comfortable at time of discharge. Will give Rx for Percocet and Naproxen for pain - Patient will follow up in GYN clinic. Informed to call the clinic to make appointment if she does not hear from them within the next few weeks. She agrees. - Discharged home in stable medical condition.  [place], AMBER 07/31/2011, 7:58 PM

## 2011-07-31 NOTE — Progress Notes (Signed)
Pt states she started having pain in her lower left sided pelvic area  Today at about 1400. Pt states pain progressively became worse.

## 2011-08-01 ENCOUNTER — Encounter: Payer: Self-pay | Admitting: *Deleted

## 2011-08-20 ENCOUNTER — Encounter: Payer: Self-pay | Admitting: *Deleted

## 2011-08-23 ENCOUNTER — Encounter: Payer: Self-pay | Admitting: Obstetrics & Gynecology

## 2011-08-30 ENCOUNTER — Encounter: Payer: Self-pay | Admitting: Obstetrics & Gynecology

## 2011-10-03 ENCOUNTER — Ambulatory Visit (INDEPENDENT_AMBULATORY_CARE_PROVIDER_SITE_OTHER): Payer: Self-pay | Admitting: Obstetrics & Gynecology

## 2011-10-03 ENCOUNTER — Encounter: Payer: Self-pay | Admitting: Obstetrics & Gynecology

## 2011-10-03 VITALS — BP 123/83 | HR 84 | Temp 97.0°F | Ht 64.0 in | Wt 222.1 lb

## 2011-10-03 DIAGNOSIS — D259 Leiomyoma of uterus, unspecified: Secondary | ICD-10-CM | POA: Insufficient documentation

## 2011-10-03 DIAGNOSIS — Z01419 Encounter for gynecological examination (general) (routine) without abnormal findings: Secondary | ICD-10-CM

## 2011-10-03 MED ORDER — LEUPROLIDE ACETATE (3 MONTH) 11.25 MG IM KIT: 11.2500 mg | PACK | Freq: Once | INTRAMUSCULAR | Status: DC

## 2011-10-03 NOTE — Patient Instructions (Signed)
Fibroids You have been diagnosed as having a fibroid. Fibroids are smooth muscle lumps (tumors) which can occur any place in a woman's body. They are usually in the womb (uterus). The most common problem (symptom) of fibroids is bleeding. Over time this may cause low red blood cells (anemia). Other symptoms include feelings of pressure and pain in the pelvis. The diagnosis (learning what is wrong) of fibroids is made by physical exam. Sometimes tests such as an ultrasound are used. This is helpful when fibroids are felt around the ovaries and to look for tumors. TREATMENT   Most fibroids do not need surgical or medical treatment. Sometimes a tissue sample (biopsy) of the lining of the uterus is done to rule out cancer. If there is no cancer and only a small amount of bleeding, the problem can be watched.   Hormonal treatment can improve the problem.   When surgery is needed, it can consist of removing the fibroid. Vaginal birth may not be possible after the removal of fibroids. This depends on where they are and the extent of surgery. When pregnancy occurs with fibroids it is usually normal.   Your caregiver can help decide which treatments are best for you.  HOME CARE INSTRUCTIONS   Do not use aspirin as this may increase bleeding problems.   If your periods (menses) are heavy, record the number of pads or tampons used per month. Bring this information to your caregiver. This can help them determine the best treatment for you.  SEEK IMMEDIATE MEDICAL CARE IF:  You have pelvic pain or cramps not controlled with medications, or experience a sudden increase in pain.   You have an increase of pelvic bleeding between and during menses.   You feel lightheaded or have fainting spells.   You develop worsening belly (abdominal) pain.  Document Released: 05/10/2000 Document Revised: 05/02/2011 Document Reviewed: 12/31/2007 [employer] Patient Information 2012 [employer].    

## 2011-10-03 NOTE — Progress Notes (Signed)
Subjective:    Patient ID: April Wolfe, female    DOB: 22-Sep-1980, 31 y.o.   MRN: [id number]  HPIPatient's last menstrual period was 09/30/2011. [id number] Heavy periods last about a week, and she has been missing work because of heavy flow and dysmenorrhea.She would prefer to maintain fertility.  Past Medical History  Diagnosis Date  . No pertinent past medical history   . Fibroids   . DUB (dysfunctional uterine bleeding)    Past Surgical History  Procedure Date  . Tonsillectomy    Current outpatient prescriptions:naproxen (NAPROSYN) 375 MG tablet, Take 1 tablet (375 mg total) by mouth 2 (two) times daily with a meal., Disp: 45 tablet, Rfl: 0 No Known Allergies History   Social History  . Marital Status: Single    Spouse Name: N/A    Number of Children: N/A  . Years of Education: N/A   Occupational History  . Not on file.   Social History Main Topics  . Smoking status: Current Everyday Smoker -- 0.5 packs/day  . Smokeless tobacco: Not on file  . Alcohol Use: 0.5 oz/week    1 drink(s) per week  . Drug Use: No  . Sexually Active: Yes    Birth Control/ Protection: None   Other Topics Concern  . Not on file   Social History Narrative  . No narrative on file   Family History  Problem Relation Age of Onset  . Hypertension Father    CBC    Component Value Date/Time   WBC 12.9* 07/31/2011 1913   RBC 4.29 07/31/2011 1913   HGB 9.8* 07/31/2011 1913   HCT 32.8* 07/31/2011 1913   PLT 569* 07/31/2011 1913   MCV 76.5* 07/31/2011 1913   MCH 22.8* 07/31/2011 1913   MCHC 29.9* 07/31/2011 1913   RDW 16.0* 07/31/2011 1913       Review of Systems     Objective:   Physical Exam Filed Vitals:   10/03/11 1431  BP: 123/83  Pulse: 84  Temp: 97 F (36.1 C)  TempSrc: Oral  Height: 5\' 4"  (1.626 m)  Weight: 222 lb 1.6 oz (100.744 kg)   NAD, pleasant Abd: obese, no mass,mildly tender low quadrants Pelvic: mod blood, cervix posterior, pap done, uterus 10 weeks size, no masses    *RADIOLOGY REPORT*  Clinical Data: Left pelvic pain, irregular bleeding  TRANSABDOMINAL AND TRANSVAGINAL ULTRASOUND OF PELVIS  Technique: Both transabdominal and transvaginal ultrasound  examinations of the pelvis were performed. Transabdominal technique  was performed for global imaging of the pelvis including uterus,  ovaries, adnexal regions, and pelvic cul-de-sac.  Comparison: Oct 17, 2008  It was necessary to proceed with endovaginal exam following the  transabdominal exam to visualize the endometrium and adnexa.  Findings:  The uterus is enlarged and lobular in contour, measuring 13.5 x 8.9  x 9.5 cm. Multiple myometrial fibroids are again noted. The  largest is at the posterior fundus, measuring 9.3 cm ( previously  4.3 cm). The endometrial stripe is thin and homogeneous, measuring  5 mm.  Both ovaries have a normal size and appearance. The right ovary  measures 3.2 x 2.2 x 1.9 cm, and the left ovary measures 3.5 x 2.3  x 2.2 cm. There are no adnexal masses or free pelvic fluid.  IMPRESSION: Fibroid uterus. The largest fibroid is myometrial  posterior fundal, measuring 9.3 cm. This has at least doubled in  size since the prior study.  Normal endometrial stripe and ovaries.  Original Report Authenticated By: Karl Ito  Kearney Hard, M.D.         Assessment & Plan:  Fibroid uterus, menorrhagia and dysmenorrhea. Interested in myomectomy. Will get Lupron Depot 11.5 mg, RTC 6 weeks with Dr. Marice Potter to discuss surgical option  April Wolfe 10/03/2011 3:28 PM

## 2011-10-31 ENCOUNTER — Ambulatory Visit (INDEPENDENT_AMBULATORY_CARE_PROVIDER_SITE_OTHER): Payer: Self-pay

## 2011-11-04 ENCOUNTER — Ambulatory Visit (INDEPENDENT_AMBULATORY_CARE_PROVIDER_SITE_OTHER): Payer: Self-pay

## 2011-11-04 VITALS — BP 119/79 | HR 83 | Temp 98.0°F | Ht 63.0 in | Wt 218.2 lb

## 2011-11-04 DIAGNOSIS — N925 Other specified irregular menstruation: Secondary | ICD-10-CM

## 2011-11-04 DIAGNOSIS — N938 Other specified abnormal uterine and vaginal bleeding: Secondary | ICD-10-CM

## 2011-11-04 DIAGNOSIS — N949 Unspecified condition associated with female genital organs and menstrual cycle: Secondary | ICD-10-CM

## 2011-11-04 MED ORDER — LEUPROLIDE ACETATE (3 MONTH) 11.25 MG IM KIT
11.2500 mg | PACK | Freq: Once | INTRAMUSCULAR | Status: AC
  Administered 2011-11-04: 11.25 mg via INTRAMUSCULAR

## 2011-11-05 ENCOUNTER — Ambulatory Visit: Payer: Self-pay

## 2011-11-18 ENCOUNTER — Ambulatory Visit (INDEPENDENT_AMBULATORY_CARE_PROVIDER_SITE_OTHER): Payer: Self-pay | Admitting: Obstetrics & Gynecology

## 2011-11-18 ENCOUNTER — Encounter: Payer: Self-pay | Admitting: Obstetrics & Gynecology

## 2011-11-18 VITALS — BP 123/80 | HR 88 | Temp 97.9°F | Ht 63.0 in | Wt 220.0 lb

## 2011-11-18 DIAGNOSIS — D219 Benign neoplasm of connective and other soft tissue, unspecified: Secondary | ICD-10-CM

## 2011-11-18 DIAGNOSIS — D259 Leiomyoma of uterus, unspecified: Secondary | ICD-10-CM

## 2011-11-18 LAB — POCT PREGNANCY, URINE: Preg Test, Ur: NEGATIVE

## 2011-11-18 NOTE — Progress Notes (Signed)
  Subjective:    Patient ID: April Wolfe, female    DOB: 07/04/1980, 31 y.o.   MRN: [id number]  HPI  31 yo S AA  [id number] (eab and sab) with large fibroids and menorrhagia resulting in anemia. She wants to preserve her fertility. She got one depo Lupron shot 5/13. Review of Systems    monogamous for 20 years Works at [employer] Denies dysparunia Objective:   Physical Exam        Assessment & Plan:  Symptomatic fibroids, anemia- I have offered her a RATH versus an open myomectomy. She wants the myomectomy. She understands the risks of surgery.

## 2011-11-19 LAB — GC/CHLAMYDIA PROBE AMP, URINE
Chlamydia, Swab/Urine, PCR: NEGATIVE
GC Probe Amp, Urine: NEGATIVE

## 2011-11-20 ENCOUNTER — Telehealth: Payer: Self-pay | Admitting: Obstetrics and Gynecology

## 2011-11-20 NOTE — Telephone Encounter (Signed)
Patient called with concern of spotting after depo lupron for menorrhagia and fibroids. Advised patient that she could still have spotting especially that it has only been a few days since the initial shot. She may call back or go to MAU if she start to bleed too much where in she's changing pads more than 3X per hour. She is awaiting to get scheduled for myomectomy. Patient agrees.

## 2011-12-12 ENCOUNTER — Ambulatory Visit: Payer: Self-pay | Admitting: Obstetrics & Gynecology

## 2012-01-13 ENCOUNTER — Ambulatory Visit (HOSPITAL_COMMUNITY): Admission: RE | Admit: 2012-01-13 | Payer: Self-pay | Source: Ambulatory Visit | Admitting: Obstetrics & Gynecology

## 2012-01-13 ENCOUNTER — Encounter (HOSPITAL_COMMUNITY): Admission: RE | Payer: Self-pay | Source: Ambulatory Visit

## 2012-01-13 SURGERY — LAPAROTOMY, EXPLORATORY
Anesthesia: General | Site: Abdomen

## 2012-01-17 ENCOUNTER — Telehealth: Payer: Self-pay | Admitting: *Deleted

## 2012-01-17 NOTE — Telephone Encounter (Signed)
Message left by representative for [facility] asking whether or not pt needed a refill sent for Lupron Depot.  Review of chart reveals that pt April Wolfe @ clinic on 12/12/11.  Message will be sent to Dr. Marice Potter for review- she saw pt on 11/18/11 for surgical consult.

## 2012-04-09 NOTE — Telephone Encounter (Signed)
It would be great if she gets Lupron. But I realize she DNKA.

## 2012-04-16 ENCOUNTER — Encounter (HOSPITAL_COMMUNITY): Payer: Self-pay | Admitting: *Deleted

## 2012-04-16 ENCOUNTER — Inpatient Hospital Stay (HOSPITAL_COMMUNITY)
Admission: AD | Admit: 2012-04-16 | Discharge: 2012-04-17 | Disposition: A | Discharge status: Hospice | Payer: Self-pay | Source: Ambulatory Visit | Attending: Obstetrics & Gynecology | Admitting: Obstetrics & Gynecology

## 2012-04-16 ENCOUNTER — Inpatient Hospital Stay (HOSPITAL_COMMUNITY): Payer: Self-pay

## 2012-04-16 DIAGNOSIS — N949 Unspecified condition associated with female genital organs and menstrual cycle: Secondary | ICD-10-CM | POA: Insufficient documentation

## 2012-04-16 DIAGNOSIS — A749 Chlamydial infection, unspecified: Secondary | ICD-10-CM

## 2012-04-16 DIAGNOSIS — D259 Leiomyoma of uterus, unspecified: Secondary | ICD-10-CM | POA: Insufficient documentation

## 2012-04-16 DIAGNOSIS — N938 Other specified abnormal uterine and vaginal bleeding: Secondary | ICD-10-CM | POA: Insufficient documentation

## 2012-04-16 DIAGNOSIS — A549 Gonococcal infection, unspecified: Secondary | ICD-10-CM

## 2012-04-16 DIAGNOSIS — D219 Benign neoplasm of connective and other soft tissue, unspecified: Secondary | ICD-10-CM

## 2012-04-16 HISTORY — DX: Gonococcal infection, unspecified: A54.9

## 2012-04-16 HISTORY — DX: Chlamydial infection, unspecified: A74.9

## 2012-04-16 LAB — WET PREP, GENITAL
Clue Cells Wet Prep HPF POC: NONE SEEN
WBC, Wet Prep HPF POC: NONE SEEN
Yeast Wet Prep HPF POC: NONE SEEN

## 2012-04-16 LAB — CBC
MCH: 25.2 pg — ABNORMAL LOW (ref 26.0–34.0)
MCV: 79.9 fL (ref 78.0–100.0)
Platelets: 475 10*3/uL — ABNORMAL HIGH (ref 150–400)
RBC: 4.17 MIL/uL (ref 3.87–5.11)
RDW: 17.4 % — ABNORMAL HIGH (ref 11.5–15.5)
WBC: 10.3 10*3/uL (ref 4.0–10.5)

## 2012-04-16 MED ORDER — KETOROLAC TROMETHAMINE 60 MG/2ML IM SOLN
60.0000 mg | Freq: Once | INTRAMUSCULAR | Status: AC
  Administered 2012-04-16: 60 mg via INTRAMUSCULAR
  Filled 2012-04-16: qty 2

## 2012-04-16 NOTE — MAU Note (Signed)
Woke up in a lot of pain and a puddle of blood at 3 am.  Has been having to change her Ultra tampons and overnight pads hourly since then.  Feeling really tired and drained, denies any dizziness.  Has hx of fibroids and was given a shot of some medication to "shrink" fibroids and slow down her bleeding.  Prior to shot in May was having regular menses.  Since May, periods have become irregular and bleeding has gotten heavier. Last menses 11/20/11.  Having lower back pain and Pain stronger on right side but can be felt on her left with shooting pains down her legs.

## 2012-04-16 NOTE — MAU Provider Note (Signed)
History     CSN: [id number]  Arrival date and time: 04/16/12 2118   First Provider Initiated Contact with Patient 04/16/12 2238      Chief Complaint  Patient presents with  . Vaginal Bleeding   HPI  Pt is not pregnant and presents with heavy vaginal bleeding with known fibroids.  Pt was seen in March in the GYN clinic and given Lupron in May or June.  Pt's LMP was June 2013. She was scheduled for surgery in August but pt could not afford and did not follow up with the GYN clinic for other options.She had spotting 2 months ago and then woke up this morning with heavy bleeding, with saturating a tampon an hour with small clots.  She took a Goody powder this morning at 9 am with a little relief of pain.  Past Medical History  Diagnosis Date  . No pertinent past medical history   . Fibroids   . DUB (dysfunctional uterine bleeding)   . Obesity     Past Surgical History  Procedure Date  . Tonsillectomy     Family History  Problem Relation Age of Onset  . Hypertension Father     History  Substance Use Topics  . Smoking status: Current Every Day Smoker -- 0.5 packs/day  . Smokeless tobacco: Not on file  . Alcohol Use: 0.5 oz/week    1 drink(s) per week     Comment: socially    Allergies: No Known Allergies  Prescriptions prior to admission  Medication Sig Dispense Refill  . Aspirin-Acetaminophen-Caffeine (GOODY HEADACHE PO) Take 1 Package by mouth daily as needed. Takes for headace        Review of Systems  Constitutional: Negative for fever and chills.  Gastrointestinal: Positive for abdominal pain. Negative for diarrhea and constipation.  Genitourinary: Negative for dysuria and urgency.   Physical Exam   Blood pressure 129/81, pulse 72, temperature 98 F (36.7 C), temperature source Oral, resp. rate 18, height 5\' 4"  (1.626 m), weight 225 lb (102.059 kg), last menstrual period 11/20/2011.  Physical Exam  Nursing note and vitals reviewed. Constitutional: She  is oriented to person, place, and time. She appears well-developed and well-nourished.       uncomfortable appearing  HENT:  Head: Normocephalic.  Eyes: Pupils are equal, round, and reactive to light.  Neck: Normal range of motion. Neck supple.  Cardiovascular: Normal rate.   Respiratory: Effort normal.  GI: Soft. She exhibits no distension. There is tenderness. There is guarding. There is no rebound.       Uterus palpated 2 FB below umbilicus with mod tenderness; no rebound  Genitourinary:       Mod amount of dark red blood in vault; cervix unable to visualize due to discomfort of exam; uterus enlarged, irregular and tender  Musculoskeletal: Normal range of motion.  Neurological: She is alert and oriented to person, place, and time.  Skin: Skin is warm and dry.  Psychiatric: She has a normal mood and affect.    MAU Course  Procedures Results for orders placed during the hospital encounter of 04/16/12 (from the past 24 hour(s))  POCT PREGNANCY, URINE     Status: Normal   Collection Time   04/16/12  9:59 PM      Component Value Range   Preg Test, Ur NEGATIVE  NEGATIVE  CBC     Status: Abnormal   Collection Time   04/16/12 10:50 PM      Component Value Range   WBC  10.3  4.0 - 10.5 K/uL   RBC 4.17  3.87 - 5.11 MIL/uL   Hemoglobin 10.5 (*) 12.0 - 15.0 g/dL   HCT 16.1 (*) 09.6 - 04.5 %   MCV 79.9  78.0 - 100.0 fL   MCH 25.2 (*) 26.0 - 34.0 pg   MCHC 31.5  30.0 - 36.0 g/dL   RDW 40.9 (*) 81.1 - 91.4 %   Platelets 475 (*) 150 - 400 K/uL  WET PREP, GENITAL     Status: Normal   Collection Time   04/16/12 11:20 PM      Component Value Range   Yeast Wet Prep HPF POC NONE SEEN  NONE SEEN   Trich, Wet Prep NONE SEEN  NONE SEEN   Clue Cells Wet Prep HPF POC NONE SEEN  NONE SEEN   WBC, Wet Prep HPF POC NONE SEEN  NONE SEEN    Assessment and Plan  Fibroids Pain management with NSAID and percocet F/u with GYN for Luprin and f/u appointment  Britini Garcilazo 04/16/2012, 10:39 PM

## 2012-04-17 LAB — URINE MICROSCOPIC-ADD ON

## 2012-04-17 LAB — URINALYSIS, ROUTINE W REFLEX MICROSCOPIC
Ketones, ur: NEGATIVE mg/dL
Nitrite: NEGATIVE
Specific Gravity, Urine: >1.03 — ABNORMAL HIGH (ref 1.005–1.030)
pH: 5 (ref 5.0–8.0)

## 2012-04-17 MED ORDER — OXYCODONE-ACETAMINOPHEN 5-325 MG PO TABS: 2.0000 | ORAL_TABLET | ORAL | Status: DC | PRN

## 2012-04-17 MED ORDER — MEGESTROL ACETATE 40 MG PO TABS: ORAL_TABLET | ORAL | Status: DC

## 2012-04-17 MED ORDER — IBUPROFEN 800 MG PO TABS: 800.0000 mg | ORAL_TABLET | Freq: Three times a day (TID) | ORAL | Status: DC

## 2012-04-18 LAB — GC/CHLAMYDIA PROBE AMP, GENITAL
Chlamydia, DNA Probe: POSITIVE — AB
GC Probe Amp, Genital: POSITIVE — AB

## 2012-04-20 NOTE — Telephone Encounter (Signed)
Called pt and spoke with her after receiving message that she had been seen @ MAU on 11/21 for heavy vaginal bleeding. We discussed her getting another injection of Lupron (as recommended by Pamelia Hoit, NP) and that in order to receive the free medication she will need to supply a copy of a denial letter from [facility].  Pt states that when she went to apply for Medicaid, she was told that she did not qualify so she did not actually submit an application. She was not given a denial letter. She also expressed concern and hesitation to receive another Lupron injection because she does not like how it makes her feel ; i.e. hot flashes. She states that she is taking the Megace as prescribed and her bleeding has now stopped. That was her biggest concern and she is feeling better now. She wants to return to the clinic to see the doctor and be scheduled for surgery- hysterectomy vs myomectomy.  I explained to pt that she will need insurance or be able to pay for the surgery up front prior to surgery date unless her surgery is an emergency which it is not at this time.  Pt voiced understanding and we discussed that she should try going to the Healthcare.gov site to see about insurance. Pt was also informed that she may complete an application for [facility] charity care if she desires. Pt stated that she would look into these options over the next couple of weeks and she was given a clinic appt for 12/18 @ 1545. Pt voiced understanding of all information discussed during our conversation.

## 2012-04-27 NOTE — MAU Provider Note (Signed)
Attestation of Attending Supervision of Advanced Practitioner (CNM/NP): Evaluation and management procedures were performed by the Advanced Practitioner under my supervision and collaboration. I have reviewed the Advanced Practitioner's note and chart, and I agree with the management and plan.  LEGGETT,KELLY H. 9:28 PM   

## 2012-05-13 ENCOUNTER — Encounter: Payer: Self-pay | Admitting: Obstetrics & Gynecology

## 2012-05-13 ENCOUNTER — Ambulatory Visit (INDEPENDENT_AMBULATORY_CARE_PROVIDER_SITE_OTHER): Payer: Self-pay | Admitting: Obstetrics & Gynecology

## 2012-05-13 ENCOUNTER — Ambulatory Visit: Payer: Self-pay | Admitting: Obstetrics & Gynecology

## 2012-05-13 VITALS — BP 132/85 | HR 82 | Temp 97.0°F | Resp 20 | Ht 63.0 in | Wt 225.5 lb

## 2012-05-13 DIAGNOSIS — D219 Benign neoplasm of connective and other soft tissue, unspecified: Secondary | ICD-10-CM

## 2012-05-13 DIAGNOSIS — D259 Leiomyoma of uterus, unspecified: Secondary | ICD-10-CM

## 2012-05-13 DIAGNOSIS — D649 Anemia, unspecified: Secondary | ICD-10-CM

## 2012-05-13 NOTE — Progress Notes (Signed)
Pt desires surgery- does not want Lupron injections.

## 2012-05-13 NOTE — Progress Notes (Signed)
  Subjective:    Patient ID: April Wolfe, female    DOB: 11-Oct-1980, 31 y.o.   MRN: [id number]  HPI  31 yo S AA P2 (10 and 12), monogamous for 17 years. She has a large fibroid that is causing her to be anemic. She told me that she wanted to preserve her fertility at an earlier office visit this year, but as of today, she and her partner/husband have decided that they do not want more children. She therefore wants to schedule a hysterectomy. She would prefer the robotic approach, but she understands that she will be contacted by financial counselor.  Review of Systems She is a Conservation officer, nature at [employer].    Objective:   Physical Exam        Assessment & Plan:  As above

## 2012-05-13 NOTE — Patient Instructions (Signed)
Hysterectomy Information  A hysterectomy is a procedure where your uterus is surgically removed. It will no longer be possible to have menstrual periods or to become pregnant. The tubes and ovaries can be removed (bilateral salpingo-oopherectomy) during this surgery as well.  REASONS FOR A HYSTERECTOMY  Persistent, abnormal bleeding.  Lasting (chronic) pelvic pain or infection.  The lining of the uterus (endometrium) starts growing outside the uterus (endometriosis).  The endometrium starts growing in the muscle of the uterus (adenomyosis).  The uterus falls down into the vagina (pelvic organ prolapse).  Symptomatic uterine fibroids.  Precancerous cells.  Cervical cancer or uterine cancer. TYPES OF HYSTERECTOMIES  Supracervical hysterectomy. This type removes the top part of the uterus, but not the cervix.  Total hysterectomy. This type removes the uterus and cervix.  Radical hysterectomy. This type removes the uterus, cervix, and the fibrous tissue that holds the uterus in place in the pelvis (parametrium). WAYS A HYSTERECTOMY CAN BE PERFORMED  Abdominal hysterectomy. A large surgical cut (incision) is made in the abdomen. The uterus is removed through this incision.  Vaginal hysterectomy. An incision is made in the vagina. The uterus is removed through this incision. There are no abdominal incisions.  Conventional laparoscopic hysterectomy. A thin, lighted tube with a camera (laparoscope) is inserted into 3 or 4 small incisions in the abdomen. The uterus is cut into small pieces. The small pieces are removed through the incisions, or they are removed through the vagina.  Laparoscopic assisted vaginal hysterectomy (LAVH). Three or four small incisions are made in the abdomen. Part of the surgery is performed laparoscopically and part vaginally. The uterus is removed through the vagina.  Robot-assisted laparoscopic hysterectomy. A laparoscope is inserted into 3 or 4 small  incisions in the abdomen. A computer-controlled device is used to give the surgeon a 3D image. This allows for more precise movements of surgical instruments. The uterus is cut into small pieces and removed through the incisions or removed through the vagina. RISKS OF HYSTERECTOMY   Bleeding and risk of blood transfusion. Tell your caregiver if you do not want to receive any blood products.  Blood clots in the legs or lung.  Infection.  Injury to surrounding organs.  Anesthesia problems or side effects.  Conversion to an abdominal hysterectomy. WHAT TO EXPECT AFTER A HYSTERECTOMY  You will be given pain medicine.  You will need to have someone with you for the first 3 to 5 days after you go home.  You will need to follow up with your surgeon in 2 to 4 weeks after surgery to evaluate your progress.  You may have early menopause symptoms like hot flashes, night sweats, and insomnia.  If you had a hysterectomy for a problem that was not a cancer or a condition that could lead to cancer, then you no longer need Pap tests. However, even if you no longer need a Pap test, a regular exam is a good idea to make sure no other problems are starting. Document Released: 11/06/2000 Document Revised: 08/05/2011 Document Reviewed: 12/22/2010 [facility] Patient Information 2013 [place], [place]. Fibroids Fibroids are lumps (tumors) that can occur any place in a woman's body. These lumps are not cancerous. Fibroids vary in size, weight, and where they grow. HOME CARE  Do not take aspirin.  Write down the number of pads or tampons you use during your period. Tell your doctor. This can help determine the best treatment for you. GET HELP RIGHT AWAY IF:  You have pain in  your lower belly (abdomen) that is not helped with medicine.  You have cramps that are not helped with medicine.  You have more bleeding between or during your period.  You feel lightheaded or pass out (faint).  Your lower belly  pain gets worse. MAKE SURE YOU:  Understand these instructions.  Will watch your condition.  Will get help right away if you are not doing well or get worse. Document Released: 06/15/2010 Document Revised: 08/05/2011 Document Reviewed: 06/15/2010 [facility] Patient Information 2013 [place], [place].

## 2012-07-13 ENCOUNTER — Inpatient Hospital Stay (HOSPITAL_COMMUNITY): Admission: RE | Admit: 2012-07-13 | Payer: Self-pay | Source: Ambulatory Visit | Admitting: Obstetrics & Gynecology

## 2012-07-13 ENCOUNTER — Encounter (HOSPITAL_COMMUNITY): Admission: RE | Payer: Self-pay | Source: Ambulatory Visit

## 2012-07-13 SURGERY — ROBOTIC ASSISTED TOTAL HYSTERECTOMY
Anesthesia: Choice | Site: Abdomen

## 2012-08-05 ENCOUNTER — Inpatient Hospital Stay (HOSPITAL_COMMUNITY)
Admission: AD | Admit: 2012-08-05 | Discharge: 2012-08-05 | Disposition: A | Discharge status: Home / Self Care | Payer: Self-pay | Source: Ambulatory Visit | Attending: Obstetrics and Gynecology | Admitting: Obstetrics and Gynecology

## 2012-08-05 ENCOUNTER — Encounter (HOSPITAL_COMMUNITY): Payer: Self-pay

## 2012-08-05 DIAGNOSIS — N938 Other specified abnormal uterine and vaginal bleeding: Secondary | ICD-10-CM | POA: Insufficient documentation

## 2012-08-05 DIAGNOSIS — N949 Unspecified condition associated with female genital organs and menstrual cycle: Secondary | ICD-10-CM | POA: Insufficient documentation

## 2012-08-05 DIAGNOSIS — D649 Anemia, unspecified: Secondary | ICD-10-CM | POA: Insufficient documentation

## 2012-08-05 DIAGNOSIS — R109 Unspecified abdominal pain: Secondary | ICD-10-CM | POA: Insufficient documentation

## 2012-08-05 DIAGNOSIS — D259 Leiomyoma of uterus, unspecified: Secondary | ICD-10-CM | POA: Insufficient documentation

## 2012-08-05 LAB — CBC
MCH: 24.2 pg — ABNORMAL LOW (ref 26.0–34.0)
MCHC: 31.2 g/dL (ref 30.0–36.0)
MCV: 77.5 fL — ABNORMAL LOW (ref 78.0–100.0)
Platelets: 488 10*3/uL — ABNORMAL HIGH (ref 150–400)
RDW: 15.6 % — ABNORMAL HIGH (ref 11.5–15.5)

## 2012-08-05 MED ORDER — KETOROLAC TROMETHAMINE 60 MG/2ML IM SOLN
60.0000 mg | Freq: Once | INTRAMUSCULAR | Status: AC
  Administered 2012-08-05: 60 mg via INTRAMUSCULAR
  Filled 2012-08-05: qty 2

## 2012-08-05 MED ORDER — OXYCODONE-ACETAMINOPHEN 5-325 MG PO TABS: 1.0000 | ORAL_TABLET | ORAL | Status: DC | PRN

## 2012-08-05 MED ORDER — MEGESTROL ACETATE 40 MG PO TABS: ORAL_TABLET | ORAL | Status: DC

## 2012-08-05 MED ORDER — IBUPROFEN 800 MG PO TABS: 800.0000 mg | ORAL_TABLET | Freq: Three times a day (TID) | ORAL | Status: DC

## 2012-08-05 NOTE — MAU Provider Note (Signed)
Attestation of Attending Supervision of Advanced Practitioner: Evaluation and management procedures were performed by the PA/NP/CNM/OB Fellow under my supervision/collaboration. Chart reviewed and agree with management and plan.  Tilda Burrow 08/05/2012 8:56 PM

## 2012-08-05 NOTE — MAU Note (Signed)
Patient states she has a history of fibroids. Was scheduled to have surgery but was unable to. Patient states she has been bleeding every day since 2-14, for the past 2 weeks has been heavy. Has been having abdominal pain, difficulty having bowel movements and leaking bladder. Feels tired and unable to sleep.

## 2012-08-05 NOTE — MAU Provider Note (Signed)
History     CSN: [id number]  Arrival date and time: 08/05/12 1636   First Provider Initiated Contact with Patient 08/05/12 1747      Chief Complaint  Patient presents with  . Vaginal Bleeding  . Abdominal Pain   HPI April Wolfe 32 y.o. Comes to MAU with vaginal bleeding x 2 weeks and abdominal pain.  Has known fibroids and was scheduled for surgery through GYN clinic.  Cancelled surgery as she could not afford the out of pocket expenses.  Is out of all medications.  Has applied for Medicaid.  Expects Medicaid on 08-25-12.  On Visit in Nov. 2013 was positive for both GC and Chlamydia.  She and her partner were treated.  OB History   Grav Para Term Preterm Abortions TAB SAB Ect Mult Living   3 2 1 1 1  0 1 0 0 2      Past Medical History  Diagnosis Date  . No pertinent past medical history   . Fibroids   . DUB (dysfunctional uterine bleeding)   . Obesity   . Chlamydia 04/16/12  . Gonorrhea 04/16/12    Past Surgical History  Procedure Laterality Date  . Tonsillectomy      Family History  Problem Relation Age of Onset  . Hypertension Father   . Hypertension Mother   . Diabetes Mother   . Hypertension Brother   . Sarcoidosis Brother   . Kidney disease Brother   . Hypertension Brother   . Diabetes Brother     History  Substance Use Topics  . Smoking status: Current Every Day Smoker -- 0.25 packs/day  . Smokeless tobacco: Not on file  . Alcohol Use: 0.5 oz/week    1 drink(s) per week     Comment: socially    Allergies: No Known Allergies  Prescriptions prior to admission  Medication Sig Dispense Refill  . Aspirin-Acetaminophen-Caffeine (GOODY HEADACHE PO) Take 1 Package by mouth daily as needed. Takes for headace      . ibuprofen (ADVIL,MOTRIN) 800 MG tablet Take 800 mg by mouth every 8 (eight) hours as needed for pain.      Marland Kitchen oxyCODONE-acetaminophen (PERCOCET/ROXICET) 5-325 MG per tablet Take 2 tablets by mouth every 4 (four) hours as needed for pain.  30  tablet  0    Review of Systems  Constitutional: Negative for fever.  Gastrointestinal: Positive for abdominal pain and constipation. Negative for nausea, vomiting and diarrhea.  Genitourinary:       No vaginal discharge. Vaginal bleeding. No dysuria.   Physical Exam   Blood pressure 122/81, pulse 77, temperature 98.5 F (36.9 C), temperature source Oral, resp. rate 18, height 5\' 3"  (1.6 m), weight 105.325 kg (232 lb 3.2 oz), last menstrual period 07/10/2012, SpO2 100.00%.  Physical Exam  Nursing note and vitals reviewed. Constitutional: She is oriented to person, place, and time. She appears well-developed and well-nourished.  HENT:  Head: Normocephalic.  Eyes: EOM are normal.  Neck: Neck supple.  GI: Soft. There is tenderness. There is no rebound and no guarding.  Genitourinary:  Speculum exam: Vagina - Mod amount of dark blood,strong offensive odor Cervix - Unable to visualize fully Bimanual exam: Cervix closed Uterus tender, enlarged, 18 week size Adnexa non tender, no masses bilaterally GC/Chlam, wet prep done Chaperone present for exam.  Musculoskeletal: Normal range of motion.  Neurological: She is alert and oriented to person, place, and time.  Skin: Skin is warm and dry.  Psychiatric: She has a normal mood and  affect.    MAU Course  Procedures Results for orders placed during the hospital encounter of 08/05/12 (from the past 24 hour(s))  CBC     Status: Abnormal   Collection Time    08/05/12  5:28 PM      Result Value Range   WBC 10.4  4.0 - 10.5 K/uL   RBC 4.22  3.87 - 5.11 MIL/uL   Hemoglobin 10.2 (*) 12.0 - 15.0 g/dL   HCT 16.1 (*) 09.6 - 04.5 %   MCV 77.5 (*) 78.0 - 100.0 fL   MCH 24.2 (*) 26.0 - 34.0 pg   MCHC 31.2  30.0 - 36.0 g/dL   RDW 40.9 (*) 81.1 - 91.4 %   Platelets 488 (*) 150 - 400 K/uL  WET PREP, GENITAL     Status: None   Collection Time    08/05/12  5:58 PM      Result Value Range   Yeast Wet Prep HPF POC NONE SEEN  NONE SEEN    Trich, Wet Prep NONE SEEN  NONE SEEN   Clue Cells Wet Prep HPF POC NONE SEEN  NONE SEEN   WBC, Wet Prep HPF POC NONE SEEN  NONE SEEN    MDM Will give Toradol 60 mg IM today for pain.  Took one Goody powder this morning for pain.  1915  Pain is much less after the Toradol today.  Assessment and Plan  Abnormal vaginal bleeding Uterine fibroids Mild anemia  Plan Message sent to GYN clinic to get scheduled again for surgery (cancelled earlier due to no insurance) Continue iron tablets as you have been taking. Drink at least 8 8-oz glasses of water every day. Continue OTC Miralax as you have been taking. GC/Chlam pending. Will renew Megace, Percocet and ibuprofen.   BURLESON,TERRI 08/05/2012, 5:59 PM

## 2012-08-06 LAB — GC/CHLAMYDIA PROBE AMP: CT Probe RNA: NEGATIVE

## 2012-08-31 ENCOUNTER — Encounter: Payer: Self-pay | Admitting: Obstetrics & Gynecology

## 2012-08-31 ENCOUNTER — Ambulatory Visit (INDEPENDENT_AMBULATORY_CARE_PROVIDER_SITE_OTHER): Payer: BC Managed Care – PPO | Admitting: Obstetrics & Gynecology

## 2012-08-31 VITALS — BP 120/84 | HR 89 | Ht 63.0 in | Wt 229.9 lb

## 2012-08-31 DIAGNOSIS — D649 Anemia, unspecified: Secondary | ICD-10-CM

## 2012-08-31 DIAGNOSIS — D259 Leiomyoma of uterus, unspecified: Secondary | ICD-10-CM

## 2012-08-31 DIAGNOSIS — D219 Benign neoplasm of connective and other soft tissue, unspecified: Secondary | ICD-10-CM

## 2012-08-31 DIAGNOSIS — N949 Unspecified condition associated with female genital organs and menstrual cycle: Secondary | ICD-10-CM

## 2012-08-31 DIAGNOSIS — N938 Other specified abnormal uterine and vaginal bleeding: Secondary | ICD-10-CM

## 2012-08-31 NOTE — Progress Notes (Signed)
  Subjective:    Patient ID: April Wolfe, female    DOB: Nov 27, 1980, 32 y.o.   MRN: [id number]  HPI  April Wolfe is a 32 yo AA P2 with menorrhagia and anemia who is here to reschedule her RATH. She understands that laparatomy might be necessary if the laparoscopic approach is not successful. She is also aware that a hysterectomy would prevent her from ever having another pregnancy.  Review of Systems     Objective:   Physical Exam        Assessment & Plan:  As above. I have sent April Wolfe an email to reschedule this surgery.

## 2012-09-17 ENCOUNTER — Telehealth: Payer: Self-pay

## 2012-09-17 NOTE — Telephone Encounter (Signed)
Patient called.  She has an upcoming hysterectomy with Dr. Marice Potter scheduled for 11/13/12.  She is requesting a MD note for work just stating that she is allowed to use a stool at work.  She says the lifting and standing makes her pain and bleeding worse.  She says her pain is under control when she uses ibuprofen and percocet at the same time.  Ibuprofen alone doesn't work and if she takes the percocet she can't function at work.  She would like to speak with someone about medication management and a Dr.'s note.

## 2012-09-21 NOTE — Telephone Encounter (Signed)
Discussed patient request for stool for work with Dr. Marice Potter. Instructed patient she can not issue note for work for stool, but if patient is having problems she can  schedule appointment. Called [facility] and informed her Dr Marice Potter cannot give her a note for stool, but she can make appt to be seen. Will have front office call her tomorrow to make appointment. Request a call at [phone]

## 2012-09-21 NOTE — Telephone Encounter (Signed)
April Wolfe called again and left a message she called last week about seeing if she could get a note for work from Dr. Marice Potter for a stool at work. Per chart review works at [employer].

## 2012-10-23 ENCOUNTER — Telehealth: Payer: Self-pay | Admitting: *Deleted

## 2012-10-23 NOTE — Telephone Encounter (Addendum)
Pt left message stating that she has questions for Dr. Ellin Saba nurse regarding her surgery. 6/3  1200 - I returned pt's call and she asked how much recovery time she will require. She is scheduled for robotic hysterectomy on 11/13/12. I advised pt that she will need to remain out of work or school for approximately 2 weeks. In some cases she may not be able to return to these activities until more time has passed but the most common time is 2 weeks. She will have a follow up appt with Dr. Marice Potter after surgery to verify that she is able to resume her normal activities. Pt voiced understanding.

## 2012-10-30 ENCOUNTER — Encounter (HOSPITAL_COMMUNITY): Payer: Self-pay | Admitting: Pharmacy Technician

## 2012-11-04 ENCOUNTER — Encounter (HOSPITAL_COMMUNITY): Payer: Self-pay

## 2012-11-04 ENCOUNTER — Encounter (HOSPITAL_COMMUNITY)
Admission: RE | Admit: 2012-11-04 | Discharge: 2012-11-04 | Disposition: A | Discharge status: Home / Self Care | Payer: BC Managed Care – PPO | Source: Ambulatory Visit | Attending: Obstetrics & Gynecology | Admitting: Obstetrics & Gynecology

## 2012-11-04 DIAGNOSIS — Z01812 Encounter for preprocedural laboratory examination: Secondary | ICD-10-CM | POA: Insufficient documentation

## 2012-11-04 DIAGNOSIS — Z01818 Encounter for other preprocedural examination: Secondary | ICD-10-CM | POA: Insufficient documentation

## 2012-11-04 LAB — CBC
Hemoglobin: 10.7 g/dL — ABNORMAL LOW (ref 12.0–15.0)
MCH: 23.7 pg — ABNORMAL LOW (ref 26.0–34.0)
MCV: 75.4 fL — ABNORMAL LOW (ref 78.0–100.0)
RBC: 4.52 MIL/uL (ref 3.87–5.11)
WBC: 9.4 10*3/uL (ref 4.0–10.5)

## 2012-11-04 NOTE — Patient Instructions (Addendum)
20 Chynna Buerkle  11/04/2012   Your procedure is scheduled on:  11/13/12  Enter through the Main Entrance of [facility] at 6 AM.  Pick up the phone at the desk and dial [phone].   Call this number if you have problems the morning of surgery: [phone]   Remember:   Do not eat food:After Midnight.  Do not drink clear liquids: After Midnight.  Take these medicines the morning of surgery with A SIP OF WATER: NA   Do not wear jewelry, make-up or nail polish.  Do not wear lotions, powders, or perfumes. You may wear deodorant.  Do not shave 48 hours prior to surgery.  Do not bring valuables to the hospital.  [facility] is not responsible                  for any belongings or valuables brought to the hospital.  Contacts, dentures or bridgework may not be worn into surgery.  Leave suitcase in the car. After surgery it may be brought to your room.  For patients admitted to the hospital, checkout time is 11:00 AM the day of                discharge.   Patients discharged the day of surgery will not be allowed to drive                   home.  Name and phone number of your driver: NA  Special Instructions: Shower using CHG 2 nights before surgery and the night before surgery.  If you shower the day of surgery use CHG.  Use special wash - you have one bottle of CHG for all showers.  You should use approximately 1/3 of the bottle for each shower.   Please read over the following fact sheets that you were given: MRSA Information              20 Tasheba Glascoe  11/04/2012   Your procedure is scheduled on:  11/13/12  Enter through the Main Entrance of [facility] at 6 AM.  Pick up the phone at the desk and dial [phone].   Call this number if you have problems the morning of surgery: [phone]   Remember:   Do not eat food:After Midnight.  Do not drink clear liquids: After Midnight.  Take these medicines the morning of surgery with A SIP OF WATER: NA   Do not wear jewelry,  make-up or nail polish.  Do not wear lotions, powders, or perfumes. You may wear deodorant.  Do not shave 48 hours prior to surgery.  Do not bring valuables to the hospital.  [facility] is not responsible                  for any belongings or valuables brought to the hospital.  Contacts, dentures or bridgework may not be worn into surgery.  Leave suitcase in the car. After surgery it may be brought to your room.  For patients admitted to the hospital, checkout time is 11:00 AM the day of                discharge.   Patients discharged the day of surgery will not be allowed to drive                   home.  Name and phone number of your driver: NA  Special Instructions: Shower using CHG 2 nights before surgery and the night before  surgery.  If you shower the day of surgery use CHG.  Use special wash - you have one bottle of CHG for all showers.  You should use approximately 1/3 of the bottle for each shower.   Please read over the following fact sheets that you were given: MRSA Information

## 2012-11-13 ENCOUNTER — Ambulatory Visit (HOSPITAL_COMMUNITY): Payer: BC Managed Care – PPO | Admitting: Anesthesiology

## 2012-11-13 ENCOUNTER — Encounter (HOSPITAL_COMMUNITY): Payer: Self-pay

## 2012-11-13 ENCOUNTER — Encounter (HOSPITAL_COMMUNITY)
Admission: RE | Disposition: A | Discharge status: Home / Self Care | Payer: Self-pay | Source: Ambulatory Visit | Attending: Obstetrics & Gynecology

## 2012-11-13 ENCOUNTER — Encounter (HOSPITAL_COMMUNITY): Payer: Self-pay | Admitting: Anesthesiology

## 2012-11-13 ENCOUNTER — Ambulatory Visit (HOSPITAL_COMMUNITY)
Admission: RE | Admit: 2012-11-13 | Discharge: 2012-11-14 | Disposition: A | Discharge status: Home / Self Care | Payer: BC Managed Care – PPO | Source: Ambulatory Visit | Attending: Obstetrics & Gynecology | Admitting: Obstetrics & Gynecology

## 2012-11-13 DIAGNOSIS — D649 Anemia, unspecified: Secondary | ICD-10-CM | POA: Diagnosis present

## 2012-11-13 DIAGNOSIS — N949 Unspecified condition associated with female genital organs and menstrual cycle: Secondary | ICD-10-CM | POA: Insufficient documentation

## 2012-11-13 DIAGNOSIS — D6489 Other specified anemias: Secondary | ICD-10-CM | POA: Insufficient documentation

## 2012-11-13 DIAGNOSIS — D259 Leiomyoma of uterus, unspecified: Secondary | ICD-10-CM

## 2012-11-13 DIAGNOSIS — N920 Excessive and frequent menstruation with regular cycle: Secondary | ICD-10-CM | POA: Insufficient documentation

## 2012-11-13 HISTORY — PX: CYSTOSCOPY: SHX5120

## 2012-11-13 HISTORY — PX: ROBOTIC ASSISTED TOTAL HYSTERECTOMY: SHX6085

## 2012-11-13 HISTORY — PX: BILATERAL SALPINGECTOMY: SHX5743

## 2012-11-13 LAB — PREGNANCY, URINE: Preg Test, Ur: NEGATIVE

## 2012-11-13 SURGERY — ROBOTIC ASSISTED TOTAL HYSTERECTOMY
Anesthesia: General | Site: Urethra | Wound class: Clean Contaminated

## 2012-11-13 MED ORDER — IBUPROFEN 800 MG PO TABS
800.0000 mg | ORAL_TABLET | Freq: Three times a day (TID) | ORAL | Status: DC | PRN
  Administered 2012-11-13: 800 mg via ORAL
  Filled 2012-11-13: qty 1

## 2012-11-13 MED ORDER — DIPHENHYDRAMINE HCL 50 MG/ML IJ SOLN: 25.0000 mg | INTRAMUSCULAR | Status: DC | PRN

## 2012-11-13 MED ORDER — CEFAZOLIN SODIUM-DEXTROSE 2-3 GM-% IV SOLR
INTRAVENOUS | Status: AC
  Filled 2012-11-13: qty 50

## 2012-11-13 MED ORDER — NALOXONE HCL 1 MG/ML IJ SOLN
1.0000 ug/kg/h | INTRAVENOUS | Status: DC | PRN
  Filled 2012-11-13: qty 2

## 2012-11-13 MED ORDER — SODIUM CHLORIDE BACTERIOSTATIC 0.9 % IJ SOLN
INTRAMUSCULAR | Status: DC | PRN
  Administered 2012-11-13: 60 mL via INTRAMUSCULAR

## 2012-11-13 MED ORDER — IBUPROFEN 800 MG PO TABS: 800.0000 mg | ORAL_TABLET | Freq: Three times a day (TID) | ORAL | Status: DC | PRN

## 2012-11-13 MED ORDER — INDIGOTINDISULFONATE SODIUM 8 MG/ML IJ SOLN
INTRAMUSCULAR | Status: DC | PRN
  Administered 2012-11-13: 5 mL via INTRAVENOUS

## 2012-11-13 MED ORDER — FENTANYL CITRATE 0.05 MG/ML IJ SOLN: 25.0000 ug | INTRAMUSCULAR | Status: DC | PRN

## 2012-11-13 MED ORDER — HYDROMORPHONE HCL PF 1 MG/ML IJ SOLN
INTRAMUSCULAR | Status: DC | PRN
  Administered 2012-11-13: .5 mg via INTRAVENOUS
  Administered 2012-11-13: .3 mg via INTRAVENOUS
  Administered 2012-11-13: .5 mg via INTRAVENOUS
  Administered 2012-11-13: .2 mg via INTRAVENOUS
  Administered 2012-11-13: 0.5 mg via INTRAVENOUS

## 2012-11-13 MED ORDER — MIDAZOLAM HCL 2 MG/2ML IJ SOLN
INTRAMUSCULAR | Status: AC
  Filled 2012-11-13: qty 2

## 2012-11-13 MED ORDER — ONDANSETRON HCL 4 MG/2ML IJ SOLN
INTRAMUSCULAR | Status: AC
  Filled 2012-11-13: qty 2

## 2012-11-13 MED ORDER — ARTIFICIAL TEARS OP OINT
TOPICAL_OINTMENT | OPHTHALMIC | Status: AC
  Filled 2012-11-13: qty 3.5

## 2012-11-13 MED ORDER — NALBUPHINE HCL 10 MG/ML IJ SOLN
5.0000 mg | INTRAMUSCULAR | Status: DC | PRN
  Filled 2012-11-13: qty 1

## 2012-11-13 MED ORDER — NEOSTIGMINE METHYLSULFATE 1 MG/ML IJ SOLN
INTRAMUSCULAR | Status: AC
  Filled 2012-11-13: qty 1

## 2012-11-13 MED ORDER — KETOROLAC TROMETHAMINE 30 MG/ML IJ SOLN: 15.0000 mg | Freq: Once | INTRAMUSCULAR | Status: AC | PRN

## 2012-11-13 MED ORDER — GLYCOPYRROLATE 0.2 MG/ML IJ SOLN
INTRAMUSCULAR | Status: DC | PRN
  Administered 2012-11-13: 0.6 mg via INTRAVENOUS

## 2012-11-13 MED ORDER — KETOROLAC TROMETHAMINE 30 MG/ML IJ SOLN: 30.0000 mg | Freq: Four times a day (QID) | INTRAMUSCULAR | Status: DC | PRN

## 2012-11-13 MED ORDER — PROPOFOL 10 MG/ML IV EMUL
INTRAVENOUS | Status: AC
  Filled 2012-11-13: qty 20

## 2012-11-13 MED ORDER — ONDANSETRON HCL 4 MG/2ML IJ SOLN
INTRAMUSCULAR | Status: DC | PRN
  Administered 2012-11-13: 4 mg via INTRAVENOUS

## 2012-11-13 MED ORDER — MEPERIDINE HCL 25 MG/ML IJ SOLN: 6.2500 mg | INTRAMUSCULAR | Status: DC | PRN

## 2012-11-13 MED ORDER — LACTATED RINGERS IV SOLN
INTRAVENOUS | Status: DC
  Administered 2012-11-13: 50 mL/h via INTRAVENOUS
  Administered 2012-11-13 (×2): via INTRAVENOUS

## 2012-11-13 MED ORDER — NALOXONE HCL 0.4 MG/ML IJ SOLN: 0.4000 mg | INTRAMUSCULAR | Status: DC | PRN

## 2012-11-13 MED ORDER — KETOROLAC TROMETHAMINE 60 MG/2ML IM SOLN
60.0000 mg | Freq: Once | INTRAMUSCULAR | Status: AC | PRN
  Filled 2012-11-13: qty 2

## 2012-11-13 MED ORDER — LIDOCAINE HCL (PF) 1 % IJ SOLN
INTRAMUSCULAR | Status: AC
  Filled 2012-11-13: qty 30

## 2012-11-13 MED ORDER — OXYCODONE-ACETAMINOPHEN 5-325 MG PO TABS: 1.0000 | ORAL_TABLET | ORAL | Status: DC | PRN

## 2012-11-13 MED ORDER — ACETAMINOPHEN 10 MG/ML IV SOLN
1000.0000 mg | Freq: Once | INTRAVENOUS | Status: AC
  Administered 2012-11-13: 1000 mg via INTRAVENOUS

## 2012-11-13 MED ORDER — ROCURONIUM BROMIDE 100 MG/10ML IV SOLN
INTRAVENOUS | Status: DC | PRN
  Administered 2012-11-13 (×3): 5 mg via INTRAVENOUS
  Administered 2012-11-13: 50 mg via INTRAVENOUS
  Administered 2012-11-13: 10 mg via INTRAVENOUS
  Administered 2012-11-13: 5 mg via INTRAVENOUS

## 2012-11-13 MED ORDER — MIDAZOLAM HCL 5 MG/5ML IJ SOLN
INTRAMUSCULAR | Status: DC | PRN
  Administered 2012-11-13: 2 mg via INTRAVENOUS

## 2012-11-13 MED ORDER — FENTANYL CITRATE 0.05 MG/ML IJ SOLN
INTRAMUSCULAR | Status: AC
  Filled 2012-11-13: qty 5

## 2012-11-13 MED ORDER — GLYCOPYRROLATE 0.2 MG/ML IJ SOLN
INTRAMUSCULAR | Status: AC
  Filled 2012-11-13: qty 1

## 2012-11-13 MED ORDER — STERILE WATER FOR IRRIGATION IR SOLN
Status: DC | PRN
  Administered 2012-11-13: 1000 mL

## 2012-11-13 MED ORDER — ONDANSETRON HCL 4 MG PO TABS: 4.0000 mg | ORAL_TABLET | Freq: Four times a day (QID) | ORAL | Status: DC | PRN

## 2012-11-13 MED ORDER — METOCLOPRAMIDE HCL 5 MG/ML IJ SOLN: 10.0000 mg | Freq: Three times a day (TID) | INTRAMUSCULAR | Status: DC | PRN

## 2012-11-13 MED ORDER — FENTANYL CITRATE 0.05 MG/ML IJ SOLN
INTRAMUSCULAR | Status: DC | PRN
  Administered 2012-11-13: 50 ug via INTRAVENOUS
  Administered 2012-11-13: 100 ug via INTRAVENOUS
  Administered 2012-11-13 (×2): 50 ug via INTRAVENOUS

## 2012-11-13 MED ORDER — CEFAZOLIN SODIUM-DEXTROSE 2-3 GM-% IV SOLR
2.0000 g | INTRAVENOUS | Status: AC
  Administered 2012-11-13: 2 g via INTRAVENOUS

## 2012-11-13 MED ORDER — FENTANYL CITRATE 0.05 MG/ML IJ SOLN
INTRAMUSCULAR | Status: AC
  Administered 2012-11-13: 50 ug via INTRAVENOUS
  Filled 2012-11-13: qty 2

## 2012-11-13 MED ORDER — OXYCODONE-ACETAMINOPHEN 5-325 MG PO TABS
1.0000 | ORAL_TABLET | ORAL | Status: DC | PRN
  Administered 2012-11-14 (×3): 2 via ORAL
  Filled 2012-11-13 (×4): qty 2

## 2012-11-13 MED ORDER — ROPIVACAINE HCL 5 MG/ML IJ SOLN
INTRAMUSCULAR | Status: AC
  Filled 2012-11-13: qty 60

## 2012-11-13 MED ORDER — LACTATED RINGERS IR SOLN
Status: DC | PRN
  Administered 2012-11-13: 3000 mL

## 2012-11-13 MED ORDER — SCOPOLAMINE 1 MG/3DAYS TD PT72
1.0000 | MEDICATED_PATCH | Freq: Once | TRANSDERMAL | Status: DC
  Filled 2012-11-13: qty 1

## 2012-11-13 MED ORDER — NEOSTIGMINE METHYLSULFATE 1 MG/ML IJ SOLN
INTRAMUSCULAR | Status: DC | PRN
  Administered 2012-11-13: 4 mg via INTRAVENOUS

## 2012-11-13 MED ORDER — DIPHENHYDRAMINE HCL 50 MG/ML IJ SOLN: 12.5000 mg | INTRAMUSCULAR | Status: DC | PRN

## 2012-11-13 MED ORDER — DIPHENHYDRAMINE HCL 25 MG PO CAPS
25.0000 mg | ORAL_CAPSULE | ORAL | Status: DC | PRN
  Administered 2012-11-13 – 2012-11-14 (×4): 25 mg via ORAL
  Filled 2012-11-13 (×4): qty 1

## 2012-11-13 MED ORDER — DEXAMETHASONE SODIUM PHOSPHATE 4 MG/ML IJ SOLN
INTRAMUSCULAR | Status: DC | PRN
  Administered 2012-11-13: 4 mg via INTRAVENOUS

## 2012-11-13 MED ORDER — SODIUM CHLORIDE 0.9 % IJ SOLN: 3.0000 mL | INTRAMUSCULAR | Status: DC | PRN

## 2012-11-13 MED ORDER — ROCURONIUM BROMIDE 50 MG/5ML IV SOLN
INTRAVENOUS | Status: AC
  Filled 2012-11-13: qty 1

## 2012-11-13 MED ORDER — DEXAMETHASONE SODIUM PHOSPHATE 10 MG/ML IJ SOLN
INTRAMUSCULAR | Status: AC
  Filled 2012-11-13: qty 1

## 2012-11-13 MED ORDER — METOCLOPRAMIDE HCL 5 MG/ML IJ SOLN: 10.0000 mg | Freq: Once | INTRAMUSCULAR | Status: DC | PRN

## 2012-11-13 MED ORDER — ONDANSETRON HCL 4 MG/2ML IJ SOLN: 4.0000 mg | Freq: Four times a day (QID) | INTRAMUSCULAR | Status: DC | PRN

## 2012-11-13 MED ORDER — LIDOCAINE HCL (CARDIAC) 20 MG/ML IV SOLN
INTRAVENOUS | Status: DC | PRN
  Administered 2012-11-13: 80 mg via INTRAVENOUS

## 2012-11-13 MED ORDER — FENTANYL CITRATE 0.05 MG/ML IJ SOLN
25.0000 ug | INTRAMUSCULAR | Status: DC | PRN
  Administered 2012-11-13 (×2): 50 ug via INTRAVENOUS
  Filled 2012-11-13 (×3): qty 2

## 2012-11-13 MED ORDER — ACETAMINOPHEN 10 MG/ML IV SOLN
INTRAVENOUS | Status: AC
  Filled 2012-11-13: qty 100

## 2012-11-13 MED ORDER — HYDROMORPHONE HCL PF 1 MG/ML IJ SOLN
INTRAMUSCULAR | Status: AC
  Filled 2012-11-13: qty 1

## 2012-11-13 MED ORDER — ONDANSETRON HCL 4 MG/2ML IJ SOLN: 4.0000 mg | Freq: Three times a day (TID) | INTRAMUSCULAR | Status: DC | PRN

## 2012-11-13 MED ORDER — ROPIVACAINE HCL 5 MG/ML IJ SOLN
INTRAMUSCULAR | Status: DC | PRN
  Administered 2012-11-13: 60 mL via EPIDURAL

## 2012-11-13 SURGICAL SUPPLY — 65 items
ADH SKN CLS APL DERMABOND .7 (GAUZE/BANDAGES/DRESSINGS) ×3
APL SKNCLS STERI-STRIP NONHPOA (GAUZE/BANDAGES/DRESSINGS) ×3
BAG URINE DRAINAGE (UROLOGICAL SUPPLIES) ×4 IMPLANT
BARRIER ADHS 3X4 INTERCEED (GAUZE/BANDAGES/DRESSINGS) IMPLANT
BENZOIN TINCTURE PRP APPL 2/3 (GAUZE/BANDAGES/DRESSINGS) ×4 IMPLANT
BLADE LAPAROSCOPIC MORCELL KIT (BLADE) ×1 IMPLANT
BRR ADH 4X3 ABS CNTRL BYND (GAUZE/BANDAGES/DRESSINGS)
CATH FOLEY 3WAY  5CC 16FR (CATHETERS) ×1
CATH FOLEY 3WAY 5CC 16FR (CATHETERS) ×3 IMPLANT
CLOTH BEACON ORANGE TIMEOUT ST (SAFETY) ×4 IMPLANT
CONT PATH 16OZ SNAP LID 3702 (MISCELLANEOUS) ×4 IMPLANT
COVER MAYO STAND STRL (DRAPES) ×4 IMPLANT
COVER TABLE BACK 60X90 (DRAPES) ×8 IMPLANT
COVER TIP SHEARS 8 DVNC (MISCELLANEOUS) ×3 IMPLANT
COVER TIP SHEARS 8MM DA VINCI (MISCELLANEOUS) ×1
DECANTER SPIKE VIAL GLASS SM (MISCELLANEOUS) ×4 IMPLANT
DERMABOND ADVANCED (GAUZE/BANDAGES/DRESSINGS) ×1
DERMABOND ADVANCED .7 DNX12 (GAUZE/BANDAGES/DRESSINGS) ×3 IMPLANT
DEVICE TROCAR PUNCTURE CLOSURE (ENDOMECHANICALS) IMPLANT
DRAPE HUG U DISPOSABLE (DRAPE) ×4 IMPLANT
DRAPE LG THREE QUARTER DISP (DRAPES) ×8 IMPLANT
DRAPE WARM FLUID 44X44 (DRAPE) ×4 IMPLANT
ELECT REM PT RETURN 9FT ADLT (ELECTROSURGICAL) ×4
ELECTRODE REM PT RTRN 9FT ADLT (ELECTROSURGICAL) ×3 IMPLANT
EVACUATOR SMOKE 8.L (FILTER) ×5 IMPLANT
GAUZE VASELINE 3X9 (GAUZE/BANDAGES/DRESSINGS) IMPLANT
GLOVE BIO SURGEON STRL SZ 6.5 (GLOVE) ×8 IMPLANT
GLOVE ECLIPSE 6.5 STRL STRAW (GLOVE) ×12 IMPLANT
GOWN STRL REIN XL XLG (GOWN DISPOSABLE) ×24 IMPLANT
LEGGING LITHOTOMY PAIR STRL (DRAPES) ×4 IMPLANT
MANIPULATOR UTERINE 4.5 ZUMI (MISCELLANEOUS) IMPLANT
NDL SPNL 18GX3.5 QUINCKE PK (NEEDLE) IMPLANT
NEEDLE INSUFFLATION 120MM (ENDOMECHANICALS) ×4 IMPLANT
NEEDLE SPNL 18GX3.5 QUINCKE PK (NEEDLE) IMPLANT
OCCLUDER COLPOPNEUMO (BALLOONS) ×4 IMPLANT
PACK LAVH (CUSTOM PROCEDURE TRAY) ×4 IMPLANT
PAD PREP 24X48 CUFFED NSTRL (MISCELLANEOUS) ×8 IMPLANT
PLUG CATH AND CAP STER (CATHETERS) ×4 IMPLANT
PROTECTOR NERVE ULNAR (MISCELLANEOUS) ×8 IMPLANT
SET CYSTO W/LG BORE CLAMP LF (SET/KITS/TRAYS/PACK) ×1 IMPLANT
SET IRRIG TUBING LAPAROSCOPIC (IRRIGATION / IRRIGATOR) ×4 IMPLANT
SOLUTION ELECTROLUBE (MISCELLANEOUS) ×4 IMPLANT
STRIP CLOSURE SKIN 1/2X4 (GAUZE/BANDAGES/DRESSINGS) ×3 IMPLANT
SUT VIC AB 0 CT1 27 (SUTURE) ×8
SUT VIC AB 0 CT1 27XBRD ANBCTR (SUTURE) ×6 IMPLANT
SUT VIC AB 0 CT1 27XBRD ANTBC (SUTURE) IMPLANT
SUT VICRYL 0 UR6 27IN ABS (SUTURE) ×8 IMPLANT
SUT VICRYL RAPIDE 4/0 PS 2 (SUTURE) ×8 IMPLANT
SUT VLOC 180 0 9IN  GS21 (SUTURE)
SUT VLOC 180 0 9IN GS21 (SUTURE) IMPLANT
SYR 50ML LL SCALE MARK (SYRINGE) ×4 IMPLANT
SYSTEM CONVERTIBLE TROCAR (TROCAR) IMPLANT
TIP RUMI ORANGE 6.7MMX12CM (TIP) IMPLANT
TIP UTERINE 5.1X6CM LAV DISP (MISCELLANEOUS) IMPLANT
TIP UTERINE 6.7X10CM GRN DISP (MISCELLANEOUS) IMPLANT
TIP UTERINE 6.7X6CM WHT DISP (MISCELLANEOUS) IMPLANT
TIP UTERINE 6.7X8CM BLUE DISP (MISCELLANEOUS) ×1 IMPLANT
TOWEL OR 17X24 6PK STRL BLUE (TOWEL DISPOSABLE) ×8 IMPLANT
TROCAR BLADELESS OPT 5 100 (ENDOMECHANICALS) ×4 IMPLANT
TROCAR DILATING TIP 12MM 150MM (ENDOMECHANICALS) ×4 IMPLANT
TROCAR DISP BLADELESS 8 DVNC (TROCAR) ×3 IMPLANT
TROCAR DISP BLADELESS 8MM (TROCAR) ×1
TROCAR XCEL 12X100 BLDLESS (ENDOMECHANICALS) ×1 IMPLANT
TUBING FILTER THERMOFLATOR (ELECTROSURGICAL) ×4 IMPLANT
WATER STERILE IRR 1000ML POUR (IV SOLUTION) ×12 IMPLANT

## 2012-11-13 NOTE — H&P (Signed)
April Wolfe is an 32 y.o. female. She is here today for removal of her uterus and oviducts due to her fibroids that are causing heavy periods resulting in anemia. She is aware that a laparotomy incision may be necessary. She understands that I will complete the surgery with a cystoscopy. She does have pelvic pain, but no dyspareunia (sometimes after sex).  Pertinent Gynecological History: Menses: flow is excessive with use of 10-12 pads or tampons on heaviest days Bleeding: intermenstrual bleeding Contraception: none DES exposure: denies Blood transfusions: none Sexually transmitted diseases: past history: chlamydia 2013 Previous GYN Procedures: EMBX normal  Last mammogram: n/a Date: n/a Last pap: normal Date: 2013 OB History: G2, P2  Menstrual History: Menarche age: 1  No LMP recorded.    Past Medical History  Diagnosis Date  . No pertinent past medical history   . Fibroids   . DUB (dysfunctional uterine bleeding)   . Obesity   . Chlamydia 04/16/12  . Gonorrhea 04/16/12    Past Surgical History  Procedure Laterality Date  . Tonsillectomy      Family History  Problem Relation Age of Onset  . Hypertension Father   . Hypertension Mother   . Diabetes Mother   . Hypertension Brother   . Sarcoidosis Brother   . Kidney disease Brother   . Hypertension Brother   . Diabetes Brother     Social History:  reports that she has been smoking.  She does not have any smokeless tobacco history on file. She reports that she drinks about 0.5 ounces of alcohol per week. She reports that she does not use illicit drugs.  Allergies: No Known Allergies  Prescriptions prior to admission  Medication Sig Dispense Refill  . ibuprofen (ADVIL,MOTRIN) 800 MG tablet Take 1 tablet (800 mg total) by mouth 3 (three) times daily. Take with food.  30 tablet  1  . megestrol (MEGACE) 40 MG tablet Take 40 mg by mouth daily.      Marland Kitchen oxyCODONE-acetaminophen (PERCOCET/ROXICET) 5-325 MG per tablet Take 1  tablet by mouth every 4 (four) hours as needed for pain.  20 tablet  0    ROS  Blood pressure 132/85, temperature 98.1 F (36.7 C), temperature source Oral, resp. rate 16. Physical Exam Heart- rrr Lungs-CTAB Abd- benign Pelvic- 13 week size uterus, mobile  Results for orders placed during the hospital encounter of 11/13/12 (from the past 24 hour(s))  PREGNANCY, URINE     Status: None   Collection Time    11/13/12  6:15 AM      Result Value Range   Preg Test, Ur NEGATIVE  NEGATIVE    No results found.  Assessment/Plan: Symptomatic fibroids- will plan for RATH/bilateral salpingectomy, and cystoscopy.  She understands the risks of surgery, including, but not to infection, bleeding, DVTs, damage to bowel, bladder, ureters. She wishes to proceed.     April Wolfe C. 11/13/2012, 7:02 AM

## 2012-11-13 NOTE — Anesthesia Postprocedure Evaluation (Signed)
  Anesthesia Post-op Note  Patient: April Wolfe  Procedure(s) Performed: Procedure(s): ROBOTIC ASSISTED TOTAL HYSTERECTOMY (N/A) BILATERAL SALPINGECTOMY (Bilateral) CYSTOSCOPY (N/A)  Patient Location: PACU  Anesthesia Type:General  Level of Consciousness: awake, alert  and oriented  Airway and Oxygen Therapy: Patient Spontanous Breathing  Post-op Pain: none  Post-op Assessment: Post-op Vital signs reviewed, Patient's Cardiovascular Status Stable, Respiratory Function Stable, Patent Airway, No signs of Nausea or vomiting and Pain level controlled  Post-op Vital Signs: Reviewed and stable  Complications: No apparent anesthesia complications

## 2012-11-13 NOTE — Op Note (Signed)
11/13/2012  11:32 AM  PATIENT:  April Wolfe  32 y.o. female  PRE-OPERATIVE DIAGNOSIS:  Symptomatic uterine fibroids, anemia, morbid obesity  POST-OPERATIVE DIAGNOSIS:    PROCEDURE:  Procedure(s): ROBOTIC ASSISTED TOTAL HYSTERECTOMY (N/A) BILATERAL SALPINGECTOMY (Bilateral) CYSTOSCOPY (N/A) LAPAROTOMY  SURGEON:  Surgeon(s) and Role:    * Allie Bossier, MD - Primary    PHYSICIAN ASSISTANT:   ASSISTANTS: Catalina Antigua, MD and Elsie Lincoln, MD   ANESTHESIA:   general  EBL:  Total I/O In: 2200 [I.V.:2200] Out: 350 [Urine:250; Blood:100]  BLOOD ADMINISTERED:none  DRAINS: none   LOCAL MEDICATIONS USED:  OTHER ropivicaine  SPECIMEN:  Source of Specimen:  uterus and tubes  DISPOSITION OF SPECIMEN:  PATHOLOGY  COUNTS:  YES  TOURNIQUET:  * No tourniquets in log *  DICTATION: .Dragon Dictation  PLAN OF CARE: Outpatient with extended recovery  PATIENT DISPOSITION:  PACU - hemodynamically stable.   Delay start of Pharmacological VTE agent (>24hrs) due to surgical blood loss or risk of bleeding: not applicable  The risks, benefits, and alternatives of surgery were explained, understood, and accepted. Consents were signed. All questions were answered. She was taken to the operating room and placed in the dorsal lithotomy position. She was carefully tucked and general anesthesia was applied without complication. At this point I realized that had not signed the morcellation consistent with her so I tracked down her mother and explained the situation to her and she consented for April Wolfe for the use of the morcellator. Back in the operating room I placed the Rumi uterine manipulator after sounding the uterus to 9 cm and dilating the cervix to accommodate a uterine manipulator. A Foley catheter was placed, draining clear throughout case. Gloves were changed and attention was turned to the abdomen. I injected all incision sites with a dilute ropivacaine solution. Please note that a  timeout procedure was done prior to starting this case. I placed 8 mm ports in the appropriate positions. These were as follows an 8 mm port in each lower quadrant and one to the left and above the umbilicus. I also placed a 12 mm assistant port in the right upper quadrant under direct laparoscopic visualization. Laparoscopy confirmed correct placement CO2 was used to insufflate the abdomen. Her pelvis was inspected. The upper abdomen appeared normal as did the pelvis with the exception of the extremely large uterus due to multiple fibroids. I began by cauterizing the round ligaments on each side followed by the utero-ovarian ligaments on each side. I created a bladder flap anteriorly. I pushed the bladder out of the operative site and made a colpotomy. The uterus was separated from all its pelvic attachments and I attempted to pull it out of the vagina. I cannot approximately 30% of the uterus along with the cervix to the vagina but the fibroid uterus was not going to come to the vagina. I therefore decided to proceed with the intra-abdominal morcellation. The 12 mm port was extended to accommodate the reusable morcellator. At this point we will unable to did visualization of the morcellator to proceed with morcellation. I therefore made the decision to remove the remainder of the uterus to a minilaparotomy. She was given indigo carmine and I did a cystoscopy that showed effusion of indigo carmine from both her ureteral orifices. A bladder was noted to have no abnormalities. The robot was undocked and we closed the fascia with a morcellator then placed. This was done with a 2-0 Vicryl suture using a laparoscopic closure device.  No defects were palpable. I closed the umbilical fascia with a figure-of-eight 0 Vicryl suture as well. I made a small Pfannenstiel incision after injecting with dilute ropivacaine. I separated the pyramidalis muscles with the Bovie and pulled the rectus muscles the side, allowing entry into  the peritoneum. The peritoneum with hemostats and reached in and pulled up the uterus. Both fashion with 0 Vicryl suture in a running nonlocking fashion. No defects were palpable. I closed all the subcuticular sites with 3-0 Vicryl suture. She was extubated and taken to recovery room in stable condition.

## 2012-11-13 NOTE — Op Note (Signed)
No consent signed by patient for possible morcellation. Cleo talked with patient's daughter about morcellation and daughter signed a consent for possible morcellation.

## 2012-11-13 NOTE — Transfer of Care (Signed)
Immediate Anesthesia Transfer of Care Note  Patient: Raymonda Corl  Procedure(s) Performed: Procedure(s): ROBOTIC ASSISTED TOTAL HYSTERECTOMY (N/A) BILATERAL SALPINGECTOMY (Bilateral) CYSTOSCOPY (N/A)  Patient Location: PACU  Anesthesia Type:General  Level of Consciousness: awake, alert  and oriented  Airway & Oxygen Therapy: Patient Spontanous Breathing and Patient connected to nasal cannula oxygen  Post-op Assessment: Report given to PACU RN and Post -op Vital signs reviewed and stable  Post vital signs: Reviewed and stable  Complications: No apparent anesthesia complications

## 2012-11-13 NOTE — Anesthesia Preprocedure Evaluation (Addendum)
Anesthesia Evaluation  Patient identified by MRN, date of birth, ID band Patient awake    Reviewed: Allergy & Precautions, H&P , NPO status , Patient's Chart, lab work & pertinent test results, reviewed documented beta blocker date and time   History of Anesthesia Complications Negative for: history of anesthetic complications  Airway Mallampati: II TM Distance: >3 FB Neck ROM: full    Dental  (+) Teeth Intact   Pulmonary Current Smoker,  breath sounds clear to auscultation        Cardiovascular negative cardio ROS  Rhythm:regular Rate:Normal     Neuro/Psych negative neurological ROS  negative psych ROS   GI/Hepatic negative GI ROS, Neg liver ROS,   Endo/Other  Morbid obesity  Renal/GU negative Renal ROS  Female GU complaint     Musculoskeletal negative musculoskeletal ROS (+)   Abdominal   Peds  Hematology  (+) Blood dyscrasia (hgb 10.7), anemia ,   Anesthesia Other Findings   Reproductive/Obstetrics Fibroid uterus                         Anesthesia Physical Anesthesia Plan  ASA: III  Anesthesia Plan: General ETT   Post-op Pain Management:    Induction:   Airway Management Planned:   Additional Equipment:   Intra-op Plan:   Post-operative Plan:   Informed Consent: I have reviewed the patients History and Physical, chart, labs and discussed the procedure including the risks, benefits and alternatives for the proposed anesthesia with the patient or authorized representative who has indicated his/her understanding and acceptance.     Plan Discussed with: Surgeon and CRNA  Anesthesia Plan Comments:         Anesthesia Quick Evaluation

## 2012-11-14 NOTE — Progress Notes (Signed)
Discharge instructions provided to patient at bedside.  Activity, medications, when to call the doctor, follow up appointments and community resources discussed.  No questions at this time.  Patient left unit in wheelchair in stable condition with all personal belongings and prescriptions accompanied by staff.  Osvaldo Angst, RN-----

## 2012-11-14 NOTE — Plan of Care (Signed)
Problem: Phase I Progression Outcomes Goal: Pain controlled with appropriate interventions Outcome: Completed/Met Date Met:  11/14/12 Good pain control with po Percocet Goal: I & O every 4 hrs or as ordered Outcome: Completed/Met Date Met:  11/14/12 Urine in Foley bag has now changed to yellow from blue Goal: IS, TCDB as ordered Outcome: Completed/Met Date Met:  11/14/12 Can get I/S up to 1100 Goal: Other Phase I Outcomes/Goals Outcome: Completed/Met Date Met:  11/14/12 Walked in hall and tolerated well  Problem: Phase II Progression Outcomes Goal: Pain controlled Outcome: Completed/Met Date Met:  11/14/12 Good pain control on po Percocet Goal: Progress activity as tolerated unless otherwise ordered Outcome: Completed/Met Date Met:  11/14/12 Walked in hall and tolerated well  Problem: Discharge Progression Outcomes Goal: Barriers To Progression Addressed/Resolved Outcome: Not Progressing Passing Flatus

## 2012-11-14 NOTE — Discharge Summary (Signed)
Physician Discharge Summary  Patient ID: April Wolfe MRN: [id number] DOB/AGE: 1980-12-12 32 y.o.  Admit date: 11/13/2012 Discharge date: 11/14/2012  Admission Diagnoses: symptomatic fibroids, anemia, morbid obesity  Discharge Diagnoses: same Active Problems:   Anemia   Discharged Condition: good  Hospital Course: She underwent an uncomplicated RATH, bilateral salpingectomy, and cystoscopy. I did have to do a minilaparatomy to remove the enlarged fibroid uterus. Post operatively she did well, ambulating, tolerating po without nausea or vomitting. She voiced her readiness to go home on POD #1.  Consults: None  Significant Diagnostic Studies: none  Treatments: surgery: as above  Discharge Exam: Blood pressure 114/78, pulse 78, temperature 97.4 F (36.3 C), temperature source Oral, resp. rate 16, weight 104.327 kg (230 lb), SpO2 97.00%. General appearance: alert Resp: clear to auscultation bilaterally Cardio: regular rate and rhythm, S1, S2 normal, no murmur, click, rub or gallop GI: soft, non-tender; bowel sounds normal; no masses,  no organomegaly Incision/Wound: c/d/i laparoscopy incisions and her laparatomy dressing is c/d/i  Disposition: 01-Home or Self Care     Medication List    STOP taking these medications       megestrol 40 MG tablet  Commonly known as:  MEGACE      TAKE these medications       ibuprofen 800 MG tablet  Commonly known as:  ADVIL,MOTRIN  Take 1 tablet (800 mg total) by mouth every 8 (eight) hours as needed for pain.     oxyCODONE-acetaminophen 5-325 MG per tablet  Commonly known as:  PERCOCET/ROXICET  Take 1 tablet by mouth every 4 (four) hours as needed for pain.           Follow-up Information   Follow up with Azavion Bouillon C., MD. Call in 6 weeks.   Contact information:   [address] [place] [address] [phone]       Signed: Allie Bossier 11/14/2012, 8:28 AM

## 2012-11-16 ENCOUNTER — Encounter (HOSPITAL_COMMUNITY): Payer: Self-pay | Admitting: Obstetrics & Gynecology

## 2012-12-03 ENCOUNTER — Telehealth: Payer: Self-pay

## 2012-12-03 NOTE — Telephone Encounter (Signed)
Pt called and stated that she needed a note to be released back to work.  Called pt and pt informed me that she has not had her post op appt but Dr. Marice Potter stated that all she had to was call and we would give a work release note.  I advised pt that I would need to follow up with the provider concerning this note cause a provider usually will have pt come in for post op appt before releasing them to work.  I informed pt that I would need to contact provider before you give her the letter and that it may take a couple of days.  Pt stated understanding and did not have any other questions.

## 2012-12-08 ENCOUNTER — Telehealth: Payer: Self-pay | Admitting: *Deleted

## 2012-12-08 ENCOUNTER — Encounter: Payer: Self-pay | Admitting: *Deleted

## 2012-12-08 NOTE — Telephone Encounter (Addendum)
Pt left message stating that she talked with a nurse last week and she would like a return to work letter. Dr. Marice Potter did her surgery. I spoke w/Jeanetta who talked with pt last week. She had sent a message to Dr. Marice Potter but has not received a response.  I called Dr. Marice Potter and obtained permission to provide pt with return to work letter. I called pt and informed her. She would like to return to work today and asked if I would fax the letter to her supervisor. I obtained the fax # and faxed the letter as requested. Pt also stated that she is having some soreness at her belly button. She denied swelling or drainage from the area. I advised her to take tylenol and to watch for any other changes or signs of infection and call us if these develop. Pt also needs post-op appt w/Dr. Marice Potter. She should call us in 1 week if she has not received a call from our office with appt information.  Pt voiced understanding.

## 2012-12-22 ENCOUNTER — Inpatient Hospital Stay (HOSPITAL_COMMUNITY): Payer: BC Managed Care – PPO

## 2012-12-22 ENCOUNTER — Encounter (HOSPITAL_COMMUNITY): Payer: Self-pay | Admitting: Obstetrics and Gynecology

## 2012-12-22 ENCOUNTER — Inpatient Hospital Stay (HOSPITAL_COMMUNITY)
Admission: AD | Admit: 2012-12-22 | Discharge: 2012-12-22 | Disposition: A | Discharge status: Home / Self Care | Payer: BC Managed Care – PPO | Source: Ambulatory Visit | Attending: Obstetrics and Gynecology | Admitting: Obstetrics and Gynecology

## 2012-12-22 DIAGNOSIS — G8918 Other acute postprocedural pain: Secondary | ICD-10-CM

## 2012-12-22 DIAGNOSIS — IMO0002 Reserved for concepts with insufficient information to code with codable children: Secondary | ICD-10-CM

## 2012-12-22 MED ORDER — IOHEXOL 300 MG/ML  SOLN
50.0000 mL | INTRAMUSCULAR | Status: AC
  Administered 2012-12-22: 50 mL via ORAL

## 2012-12-22 MED ORDER — IOHEXOL 300 MG/ML  SOLN
100.0000 mL | Freq: Once | INTRAMUSCULAR | Status: AC | PRN
  Administered 2012-12-22: 100 mL via INTRAVENOUS

## 2012-12-22 NOTE — MAU Note (Signed)
Patient states she had a total hysterectomy on 6-20. States she has leaking fluid from the left side of the laparotomy incision today. States the area is tender to touch.

## 2012-12-22 NOTE — MAU Provider Note (Signed)
CC: Drainage from Incision    First Provider Initiated Contact with Patient 12/22/12 1951      HPI April Wolfe is a 32 y.o. [id number]  who Is 6 weeks postop RATH, BSO, and laparotomy for symptomatic fibroids. She had been doing well and returned to work at [employer] recently until 3 days ago she began having soreness and  inceasing tenderness at left incisional margin. Today she noticed "pus" draining from the wound. Denies strenuous activity or trauma. No fever or chills. Rescheduled postop visit is not until September.   Past Medical History  Diagnosis Date  . No pertinent past medical history   . Fibroids   . DUB (dysfunctional uterine bleeding)   . Obesity   . Chlamydia 04/16/12  . Gonorrhea 04/16/12    OB History   Grav Para Term Preterm Abortions TAB SAB Ect Mult Living   3 2 1 1 1  0 1 0 0 2     # Outc Date GA Lbr Len/2nd Wgt Sex Del Anes PTL Lv   1 TRM            2 PRE            3 SAB               Past Surgical History  Procedure Laterality Date  . Tonsillectomy    . Robotic assisted total hysterectomy N/A 11/13/2012    Procedure: ROBOTIC ASSISTED TOTAL HYSTERECTOMY;  Surgeon: Allie Bossier, MD;  Location: [facility];  Service: Gynecology;  Laterality: N/A;  . Bilateral salpingectomy Bilateral 11/13/2012    Procedure: BILATERAL SALPINGECTOMY;  Surgeon: Allie Bossier, MD;  Location: [facility];  Service: Gynecology;  Laterality: Bilateral;  . Cystoscopy N/A 11/13/2012    Procedure: CYSTOSCOPY;  Surgeon: Allie Bossier, MD;  Location: [facility];  Service: Gynecology;  Laterality: N/A;    History   Social History  . Marital Status: Single    Spouse Name: N/A    Number of Children: N/A  . Years of Education: N/A   Occupational History  . Not on file.   Social History Main Topics  . Smoking status: Current Every Day Smoker -- 0.25 packs/day  . Smokeless tobacco: Not on file  . Alcohol Use: 0.5 oz/week    1 drink(s) per week     Comment: socially  . Drug Use: No   . Sexually Active: Not Currently    Birth Control/ Protection: None   Other Topics Concern  . Not on file   Social History Narrative  . No narrative on file    No current facility-administered medications on file prior to encounter.   Current Outpatient Prescriptions on File Prior to Encounter  Medication Sig Dispense Refill  . ibuprofen (ADVIL,MOTRIN) 800 MG tablet Take 1 tablet (800 mg total) by mouth every 8 (eight) hours as needed for pain.  60 tablet  2  . oxyCODONE-acetaminophen (PERCOCET/ROXICET) 5-325 MG per tablet Take 1 tablet by mouth every 4 (four) hours as needed for pain.  30 tablet  0    No Known Allergies  ROS Pertinent items in HPI  PHYSICAL EXAM Filed Vitals:   12/22/12 1811  BP: 123/79  Pulse: 85  Temp: 98 F (36.7 C)  Resp: 16   General: Obese female in no acute distress Cardiovascular: Normal rate Respiratory: Normal effort Abdomen: Soft, obese nontender Except circumscribed area around left incisional edge. Able to express small amount serosanguineous fluid, however skin appeared basically intact  and would not admit Q-tip. Back: No CVAT Extremities: No edema Neurologic: Alert and oriented  MAU COURSE C/W Dr. Jolayne Panther CT abd/pelvis  Care assumed by Thressa Sheller CNM at 2100  Ct Abdomen Pelvis W Contrast  12/22/2012   *RADIOLOGY REPORT*  Clinical Data: incision drainage.  Evaluate for abscess.  CT ABDOMEN AND PELVIS WITH CONTRAST  Technique:  Multidetector CT imaging of the abdomen and pelvis was performed following the standard protocol during bolus administration of intravenous contrast.  Contrast: OMNIPAQUE IOHEXOL 300 MG/ML  SOLN  Comparison: 05/07/2007 CT.  Findings: Lung Bases: Clear.  Liver:  Normal.  Spleen:  Normal.  Gallbladder:  Normal.  Common bile duct:  Normal.  Pancreas:  Normal.  Adrenal glands:  Normal bilaterally.  Kidneys:  There is early excretion of contrast from the kidneys associated with difficult intravenous access.   Normal renal enhancement.  No inflammatory changes.  Stomach:  Partially decompressed.  Normal.  Small bowel:  Normal.  No obstruction.  Colon:   Normal appendix.  Moderate stool burden in the colon.  No inflammatory changes.  Pelvic Genitourinary:  Hysterectomy.  Left ovary appears within normal limits.  There is a cyst in the right anatomic pelvis, probably representing a right ovarian cyst.Lymphocele considered less likely.  This measures 7.2 cm x 5.8 cm.  This has benign features. Urinary bladder appears normal.  Bones:  No aggressive osseous lesions.  Vasculature: Normal.  Body Wall: Transverse lower abdominal incision is present with stranding in the subcutaneous fat.  There is a single tiny locule of gas in the right margin of the incision in the superficial subcutaneous fat.  There is no discrete abscess.  No intra- abdominal abscess is identified.  Stranding is present in the periumbilical region which may represent microscopic portal or scarring.  IMPRESSION:  1.  Hysterectomy.  Tiny locule of gas is present at the left margin of the lower transverse incision however this appears to be the superficial subcutaneous fat; no abscess. 2.  Right adnexal cystic lesion measuring 7.2 cm x 5.8 cm.  Follow- up 12-week ultrasound recommended to assess for resolution. This likely represents a benign ovarian cyst which is favored over a lymphocele in the postoperative setting. This recommendation follows ACR consensus guidelines:  White Paper of the ACR Incidental Findings Committee II on Adnexal Findings.  J Am Coll Radiol [phone].   Original Report Authenticated By: Andreas Newport, M.D.   C/W Dr. Jolayne Panther: OK for dc home and plan to FU with the clinic  Jeralene Huff, CNM 12/22/2012 8:14 PM

## 2012-12-23 ENCOUNTER — Telehealth: Payer: Self-pay | Admitting: Obstetrics and Gynecology

## 2012-12-23 NOTE — Telephone Encounter (Signed)
Patient called having the same complaint from mau (12/22/12) of drainage on th left side of incision. Patient denies fever, odor or pain. Patient wants to get earlier postop appt. Changed it to 01/04/2013 @2 :15pm. Patient satisfied.

## 2012-12-28 NOTE — MAU Provider Note (Signed)
Attestation of Attending Supervision of Advanced Practitioner (CNM/NP): Evaluation and management procedures were performed by the Advanced Practitioner under my supervision and collaboration.  I have reviewed the Advanced Practitioner's note and chart, and I agree with the management and plan.  Jaxsun Ciampi 12/28/2012 2:20 PM   

## 2013-01-04 ENCOUNTER — Encounter: Payer: Self-pay | Admitting: Obstetrics & Gynecology

## 2013-01-04 ENCOUNTER — Ambulatory Visit (INDEPENDENT_AMBULATORY_CARE_PROVIDER_SITE_OTHER): Payer: BC Managed Care – PPO | Admitting: Obstetrics & Gynecology

## 2013-01-04 VITALS — BP 127/87 | HR 86 | Temp 97.8°F | Ht 61.0 in | Wt 236.9 lb

## 2013-01-04 DIAGNOSIS — Z09 Encounter for follow-up examination after completed treatment for conditions other than malignant neoplasm: Secondary | ICD-10-CM

## 2013-01-04 NOTE — Progress Notes (Signed)
  Subjective:    Patient ID: April Wolfe, female    DOB: 1980/09/23, 32 y.o.   MRN: [id number]  HPI  32 yo AA lady now 8 weeks po s/p RATH/bilateral salpingectomy/cystoscopy/mini lap (to remove uterus). She returned to work at the 4 week post op mark. She works at [employer]. She has not had sex yet. Her only complaint is that of a "boil" and possible leaking from the left edge of the incision. Her pathology showed only fibroids.  Review of Systems     Objective:   Physical Exam Her incision has healed well. There is no leaking, even with very firm squeezing of the area. There is some dense scar tissue beneath the skin on that edge. Vaginal cuff well healed Normal bimanual exam       Assessment & Plan:  Post op stable RTC 1 year/prn sooner

## 2013-02-01 ENCOUNTER — Ambulatory Visit: Payer: BC Managed Care – PPO | Admitting: Obstetrics & Gynecology

## 2013-04-01 ENCOUNTER — Other Ambulatory Visit: Payer: Self-pay

## 2013-04-08 ENCOUNTER — Telehealth: Payer: Self-pay

## 2013-04-08 NOTE — Telephone Encounter (Signed)
Opened in error

## 2013-04-15 NOTE — Telephone Encounter (Signed)
Opened in error

## 2013-06-11 ENCOUNTER — Emergency Department (HOSPITAL_COMMUNITY)
Admission: EM | Admit: 2013-06-11 | Discharge: 2013-06-11 | Disposition: A | Discharge status: Home / Self Care | Payer: BC Managed Care – PPO | Attending: Emergency Medicine | Admitting: Emergency Medicine

## 2013-06-11 DIAGNOSIS — S91009A Unspecified open wound, unspecified ankle, initial encounter: Principal | ICD-10-CM

## 2013-06-11 DIAGNOSIS — S81852A Open bite, left lower leg, initial encounter: Secondary | ICD-10-CM

## 2013-06-11 DIAGNOSIS — W540XXA Bitten by dog, initial encounter: Secondary | ICD-10-CM | POA: Insufficient documentation

## 2013-06-11 DIAGNOSIS — Z8619 Personal history of other infectious and parasitic diseases: Secondary | ICD-10-CM | POA: Insufficient documentation

## 2013-06-11 DIAGNOSIS — Y9289 Other specified places as the place of occurrence of the external cause: Secondary | ICD-10-CM | POA: Insufficient documentation

## 2013-06-11 DIAGNOSIS — Y9301 Activity, walking, marching and hiking: Secondary | ICD-10-CM | POA: Insufficient documentation

## 2013-06-11 DIAGNOSIS — S81009A Unspecified open wound, unspecified knee, initial encounter: Secondary | ICD-10-CM | POA: Insufficient documentation

## 2013-06-11 DIAGNOSIS — E669 Obesity, unspecified: Secondary | ICD-10-CM | POA: Insufficient documentation

## 2013-06-11 DIAGNOSIS — Z23 Encounter for immunization: Secondary | ICD-10-CM | POA: Insufficient documentation

## 2013-06-11 DIAGNOSIS — S81809A Unspecified open wound, unspecified lower leg, initial encounter: Principal | ICD-10-CM

## 2013-06-11 DIAGNOSIS — F17200 Nicotine dependence, unspecified, uncomplicated: Secondary | ICD-10-CM | POA: Insufficient documentation

## 2013-06-11 DIAGNOSIS — Z8742 Personal history of other diseases of the female genital tract: Secondary | ICD-10-CM | POA: Insufficient documentation

## 2013-06-11 MED ORDER — TETANUS-DIPHTH-ACELL PERTUSSIS 5-2.5-18.5 LF-MCG/0.5 IM SUSP
0.5000 mL | Freq: Once | INTRAMUSCULAR | Status: AC
Start: 2013-06-11 — End: 2013-06-11
  Administered 2013-06-11: 0.5 mL via INTRAMUSCULAR
  Filled 2013-06-11: qty 0.5

## 2013-06-11 MED ORDER — HYDROCODONE-ACETAMINOPHEN 5-325 MG PO TABS: 1.0000 | ORAL_TABLET | ORAL | Status: DC | PRN

## 2013-06-11 MED ORDER — CEPHALEXIN 500 MG PO CAPS: 500.0000 mg | ORAL_CAPSULE | Freq: Four times a day (QID) | ORAL | Status: DC

## 2013-06-11 NOTE — ED Notes (Signed)
Pt states she was bit by a stray dog on her L leg. Pt has a lac on anterior side of L lower leg. Pt has also has puncture wounds on back of L lower leg. Bleeding controlled.  Pt ambulatory to exam room with steady gait. Pt does not remember when her last tetanus shot was. Pt states that animal control took dog into custody.

## 2013-06-11 NOTE — ED Provider Notes (Signed)
CSN: [id number]     Arrival date & time 06/11/13  1756 History   First MD Initiated Contact with Patient 06/11/13 1828     No chief complaint on file.  (Consider location/radiation/quality/duration/timing/severity/associated sxs/prior Treatment) HPI  Pt is a 33 yo female who experienced an unprovoked attack by a dog earlier today.  She states she was walking on the side walk, and all the sudden the dog was biting her.  She denies any nausea, vomiting, fever, chills, headache, photophobia, numbness, or weakness.  She is unsure of her Tetanus status.  No hx is known of the dog, but animal control was able to capture it.  Upon removing her bandages, she has 4 open wounds.  There is a 2.5 cm laceration on the anterior portion of her lower leg, with an approx 1 cm puncture wound below it.  She also has an approx 1.5 cm laceration to her posterior lower leg, with another 1 cm puncture wound below it.  Past Medical History  Diagnosis Date  . No pertinent past medical history   . Fibroids   . DUB (dysfunctional uterine bleeding)   . Obesity   . Chlamydia 04/16/12  . Gonorrhea 04/16/12   Past Surgical History  Procedure Laterality Date  . Tonsillectomy    . Robotic assisted total hysterectomy N/A 11/13/2012    Procedure: ROBOTIC ASSISTED TOTAL HYSTERECTOMY;  Surgeon: Emily Filbert, MD;  Location: [facility];  Service: Gynecology;  Laterality: N/A;  . Bilateral salpingectomy Bilateral 11/13/2012    Procedure: BILATERAL SALPINGECTOMY;  Surgeon: Emily Filbert, MD;  Location: [facility];  Service: Gynecology;  Laterality: Bilateral;  . Cystoscopy N/A 11/13/2012    Procedure: CYSTOSCOPY;  Surgeon: Emily Filbert, MD;  Location: [facility];  Service: Gynecology;  Laterality: N/A;   Family History  Problem Relation Age of Onset  . Hypertension Father   . Hypertension Mother   . Diabetes Mother   . Hypertension Brother   . Sarcoidosis Brother   . Kidney disease Brother   . Hypertension Brother   . Diabetes Brother     History  Substance Use Topics  . Smoking status: Current Every Day Smoker -- 0.25 packs/day  . Smokeless tobacco: Not on file  . Alcohol Use: 0.5 oz/week    1 drink(s) per week     Comment: socially   OB History   Grav Para Term Preterm Abortions TAB SAB Ect Mult Living   3 2 1 1 1  0 1 0 0 2     Review of Systems  Constitutional: Negative for fever.  Skin: Positive for wound.  Neurological: Negative for numbness.    Allergies  Review of patient's allergies indicates no known allergies.  Home Medications  No current outpatient prescriptions on file. LMP 11/04/2012 Physical Exam  General:  Slightly Obese pt in NAD. Psych:  Normal Affect and Mood. Heart:  No MGR Lungs:  Clear to Auscultation A/P bilaterally. Knee:  No tenderness, Full ROM. Lower Leg:  Tenderness from Mid calf to Ankle.  Non-edematous, non-erythematous.  2 open wounds on anterior portion of lower leg, 2 open wounds on posterior portion of lower leg, both as described above.  Pt denies any paresthesias to the wound or surrounding area. Sensation was intact to sharp stimuli. Feet: 2+ DP, Full ROM.  ED Course  Procedures (including critical care time)  8:13 PM Pt has unprovoked dog bite.  Dog is a pit bull, has been quarantine by animal control.  Pt prefers no  rabies series at this time.  Wound to left lower leg with deep wound to anterior leg.  Thorough irrigation along with loose sutures provided.  Pt made aware to f/u for further wound care.  Otherwise stable for discharge.    LACERATION REPAIR Performed by: Domenic Moras Authorized byDomenic Moras Consent: Verbal consent obtained. Risks and benefits: risks, benefits and alternatives were discussed Consent given by: patient Patient identity confirmed: provided demographic data Prepped and Draped in normal sterile fashion Wound explored  Laceration Location: left anterior lower leg  Laceration Length: 3cm  No Foreign Bodies seen or  palpated  Anesthesia: local infiltration  Local anesthetic: lidocaine 2% w epinephrine  Anesthetic total: 7 ml  Irrigation method: syringe Amount of cleaning: standard  Skin closure: prolene 4.0  Number of sutures: 3  Technique: simple interrupted, loose sutures  Patient tolerance: Patient tolerated the procedure well with no immediate complications.   Labs Review Labs Reviewed - No data to display Imaging Review No results found.  EKG Interpretation   None       MDM   1. Dog bite of left lower leg    BP 148/96  Pulse 90  Temp(Src) 98.4 F (36.9 C) (Oral)  Resp 17  Wt 240 lb 6.4 oz (109.045 kg)  SpO2 99%  LMP 11/04/2012  I have reviewed nursing notes and vital signs. I reviewed available ER/hospitalization records thought the EMR     Domenic Moras, [place] 06/11/13 2016

## 2013-06-11 NOTE — Discharge Instructions (Signed)
Animal Bite °An animal bite can result in a scratch on the skin, deep open cut, puncture of the skin, crush injury, or tearing away of the skin or a body part. Dogs are responsible for most animal bites. Children are bitten more often than adults. An animal bite can range from very mild to more serious. A small bite from your house pet is no cause for alarm. However, some animal bites can become infected or injure a bone or other tissue. You must seek medical care if: °· The skin is broken and bleeding does not slow down or stop after 15 minutes. °· The puncture is deep and difficult to clean (such as a cat bite). °· Pain, warmth, redness, or pus develops around the wound. °· The bite is from a stray animal or rodent. There may be a risk of rabies infection. °· The bite is from a snake, raccoon, skunk, fox, coyote, or bat. There may be a risk of rabies infection. °· The person bitten has a chronic illness such as diabetes, liver disease, or cancer, or the person takes medicine that lowers the immune system. °· There is concern about the location and severity of the bite. °It is important to clean and protect an animal bite wound right away to prevent infection. Follow these steps: °· Clean the wound with plenty of water and soap. °· Apply an antibiotic cream. °· Apply gentle pressure over the wound with a clean towel or gauze to slow or stop bleeding. °· Elevate the affected area above the heart to help stop any bleeding. °· Seek medical care. Getting medical care within 8 hours of the animal bite leads to the best possible outcome. °DIAGNOSIS  °Your caregiver will most likely: °· Take a detailed history of the animal and the bite injury. °· Perform a wound exam. °· Take your medical history. °Blood tests or X-rays may be performed. Sometimes, infected bite wounds are cultured and sent to a lab to identify the infectious bacteria.  °TREATMENT  °Medical treatment will depend on the location and type of animal bite as  well as the patient's medical history. Treatment may include: °· Wound care, such as cleaning and flushing the wound with saline solution, bandaging, and elevating the affected area. °· Antibiotics. °· Tetanus immunization. °· Rabies immunization. °· Leaving the wound open to heal. This is often done with animal bites, due to the high risk of infection. However, in certain cases, wound closure with stitches, wound adhesive, skin adhesive strips, or staples may be used. ° Infected bites that are left untreated may require intravenous (IV) antibiotics and surgical treatment in the hospital. °HOME CARE INSTRUCTIONS °· Follow your caregiver's instructions for wound care. °· Take all medicines as directed. °· If your caregiver prescribes antibiotics, take them as directed. Finish them even if you start to feel better. °· Follow up with your caregiver for further exams or immunizations as directed. °You may need a tetanus shot if: °· You cannot remember when you had your last tetanus shot. °· You have never had a tetanus shot. °· The injury broke your skin. °If you get a tetanus shot, your arm may swell, get red, and feel warm to the touch. This is common and not a problem. If you need a tetanus shot and you choose not to have one, there is a rare chance of getting tetanus. Sickness from tetanus can be serious. °SEEK MEDICAL CARE IF: °· You notice warmth, redness, soreness, swelling, pus discharge, or a bad   smell coming from the wound.  You have a red line on the skin coming from the wound.  You have a fever, chills, or a general ill feeling.  You have nausea or vomiting.  You have continued or worsening pain.  You have trouble moving the injured part.  You have other questions or concerns. MAKE SURE YOU:  Understand these instructions.  Will watch your condition.  Will get help right away if you are not doing well or get worse. Document Released: 01/29/2011 Document Revised: 08/05/2011 Document  Reviewed: 01/29/2011 [facility] Patient Information 2014 [place].  Laceration Care, Adult A laceration is a cut or lesion that goes through all layers of the skin and into the tissue just beneath the skin. TREATMENT  Some lacerations may not require closure. Some lacerations may not be able to be closed due to an increased risk of infection. It is important to see your caregiver as soon as possible after an injury to minimize the risk of infection and maximize the opportunity for successful closure. If closure is appropriate, pain medicines may be given, if needed. The wound will be cleaned to help prevent infection. Your caregiver will use stitches (sutures), staples, wound glue (adhesive), or skin adhesive strips to repair the laceration. These tools bring the skin edges together to allow for faster healing and a better cosmetic outcome. However, all wounds will heal with a scar. Once the wound has healed, scarring can be minimized by covering the wound with sunscreen during the day for 1 full year. HOME CARE INSTRUCTIONS  For sutures or staples:  Keep the wound clean and dry.  If you were given a bandage (dressing), you should change it at least once a day. Also, change the dressing if it becomes wet or dirty, or as directed by your caregiver.  Wash the wound with soap and water 2 times a day. Rinse the wound off with water to remove all soap. Pat the wound dry with a clean towel.  After cleaning, apply a thin layer of the antibiotic ointment as recommended by your caregiver. This will help prevent infection and keep the dressing from sticking.  You may shower as usual after the first 24 hours. Do not soak the wound in water until the sutures are removed.  Only take over-the-counter or prescription medicines for pain, discomfort, or fever as directed by your caregiver.  Get your sutures or staples removed as directed by your caregiver. For skin adhesive strips:  Keep the wound  clean and dry.  Do not get the skin adhesive strips wet. You may bathe carefully, using caution to keep the wound dry.  If the wound gets wet, pat it dry with a clean towel.  Skin adhesive strips will fall off on their own. You may trim the strips as the wound heals. Do not remove skin adhesive strips that are still stuck to the wound. They will fall off in time. For wound adhesive:  You may briefly wet your wound in the shower or bath. Do not soak or scrub the wound. Do not swim. Avoid periods of heavy perspiration until the skin adhesive has fallen off on its own. After showering or bathing, gently pat the wound dry with a clean towel.  Do not apply liquid medicine, cream medicine, or ointment medicine to your wound while the skin adhesive is in place. This may loosen the film before your wound is healed.  If a dressing is placed over the wound, be careful not to  apply tape directly over the skin adhesive. This may cause the adhesive to be pulled off before the wound is healed.  Avoid prolonged exposure to sunlight or tanning lamps while the skin adhesive is in place. Exposure to ultraviolet light in the first year will darken the scar.  The skin adhesive will usually remain in place for 5 to 10 days, then naturally fall off the skin. Do not pick at the adhesive film. You may need a tetanus shot if:  You cannot remember when you had your last tetanus shot.  You have never had a tetanus shot. If you get a tetanus shot, your arm may swell, get red, and feel warm to the touch. This is common and not a problem. If you need a tetanus shot and you choose not to have one, there is a rare chance of getting tetanus. Sickness from tetanus can be serious. SEEK MEDICAL CARE IF:   You have redness, swelling, or increasing pain in the wound.  You see a red line that goes away from the wound.  You have yellowish-white fluid (pus) coming from the wound.  You have a fever.  You notice a bad smell  coming from the wound or dressing.  Your wound breaks open before or after sutures have been removed.  You notice something coming out of the wound such as wood or glass.  Your wound is on your hand or foot and you cannot move a finger or toe. SEEK IMMEDIATE MEDICAL CARE IF:   Your pain is not controlled with prescribed medicine.  You have severe swelling around the wound causing pain and numbness or a change in color in your arm, hand, leg, or foot.  Your wound splits open and starts bleeding.  You have worsening numbness, weakness, or loss of function of any joint around or beyond the wound.  You develop painful lumps near the wound or on the skin anywhere on your body. MAKE SURE YOU:   Understand these instructions.  Will watch your condition.  Will get help right away if you are not doing well or get worse. Document Released: 05/13/2005 Document Revised: 08/05/2011 Document Reviewed: 11/06/2010 [facility] Patient Information 2014 [place], [place].

## 2013-06-11 NOTE — ED Provider Notes (Signed)
Medical screening examination/treatment/procedure(s) were performed by non-physician practitioner and as supervising physician I was immediately available for consultation/collaboration.  EKG Interpretation   None         Saddie Benders. Dorna Mai, MD 06/11/13 2208

## 2013-06-14 ENCOUNTER — Emergency Department (HOSPITAL_COMMUNITY)
Admission: EM | Admit: 2013-06-14 | Discharge: 2013-06-15 | Disposition: A | Discharge status: Home / Self Care | Payer: BC Managed Care – PPO | Attending: Emergency Medicine | Admitting: Emergency Medicine

## 2013-06-14 ENCOUNTER — Encounter (HOSPITAL_COMMUNITY): Payer: Self-pay | Admitting: Emergency Medicine

## 2013-06-14 DIAGNOSIS — S81809A Unspecified open wound, unspecified lower leg, initial encounter: Principal | ICD-10-CM

## 2013-06-14 DIAGNOSIS — Z792 Long term (current) use of antibiotics: Secondary | ICD-10-CM | POA: Insufficient documentation

## 2013-06-14 DIAGNOSIS — Y929 Unspecified place or not applicable: Secondary | ICD-10-CM | POA: Insufficient documentation

## 2013-06-14 DIAGNOSIS — S91009A Unspecified open wound, unspecified ankle, initial encounter: Principal | ICD-10-CM

## 2013-06-14 DIAGNOSIS — F17200 Nicotine dependence, unspecified, uncomplicated: Secondary | ICD-10-CM | POA: Insufficient documentation

## 2013-06-14 DIAGNOSIS — Z8742 Personal history of other diseases of the female genital tract: Secondary | ICD-10-CM | POA: Insufficient documentation

## 2013-06-14 DIAGNOSIS — T148XXA Other injury of unspecified body region, initial encounter: Secondary | ICD-10-CM

## 2013-06-14 DIAGNOSIS — W540XXA Bitten by dog, initial encounter: Secondary | ICD-10-CM | POA: Insufficient documentation

## 2013-06-14 DIAGNOSIS — Y939 Activity, unspecified: Secondary | ICD-10-CM | POA: Insufficient documentation

## 2013-06-14 DIAGNOSIS — L089 Local infection of the skin and subcutaneous tissue, unspecified: Secondary | ICD-10-CM

## 2013-06-14 DIAGNOSIS — E669 Obesity, unspecified: Secondary | ICD-10-CM | POA: Insufficient documentation

## 2013-06-14 DIAGNOSIS — Z8619 Personal history of other infectious and parasitic diseases: Secondary | ICD-10-CM | POA: Insufficient documentation

## 2013-06-14 DIAGNOSIS — S81009A Unspecified open wound, unspecified knee, initial encounter: Secondary | ICD-10-CM | POA: Insufficient documentation

## 2013-06-14 MED ORDER — AMPICILLIN-SULBACTAM SODIUM 3 (2-1) G IJ SOLR
3.0000 g | Freq: Four times a day (QID) | INTRAMUSCULAR | Status: DC
  Administered 2013-06-15: 3 g via INTRAVENOUS
  Filled 2013-06-14: qty 3

## 2013-06-14 NOTE — ED Provider Notes (Signed)
CSN: [id number]     Arrival date & time 06/14/13  2021 History  This chart was scribed for non-physician practitioner, Montine Circle, PA-C working with Jasper Riling. Alvino Chapel, MD by Einar Pheasant, ED scribe. This patient was seen in room WTR8/WTR8 and the patient's care was started at 10:07 PM.    Chief Complaint  Patient presents with  . Wound Check   The history is provided by the patient. No language interpreter was used.   HPI Comments: April Wolfe is a 33 y.o. female who presents to the Emergency Department requesting a wound check. Pt states that she was bit by a dog on her left leg 4 days ago. She came in to the ED following the accident and was prescribed antibiotics. However, she was unable to get the antibiotics due to low monetary funds. She reports increased pain, redness, and drainage. She denies any constipation, diarrhea, fever, chills, or nausea.  Past Medical History  Diagnosis Date  . No pertinent past medical history   . Fibroids   . DUB (dysfunctional uterine bleeding)   . Obesity   . Chlamydia 04/16/12  . Gonorrhea 04/16/12   Past Surgical History  Procedure Laterality Date  . Tonsillectomy    . Robotic assisted total hysterectomy N/A 11/13/2012    Procedure: ROBOTIC ASSISTED TOTAL HYSTERECTOMY;  Surgeon: Emily Filbert, MD;  Location: [facility];  Service: Gynecology;  Laterality: N/A;  . Bilateral salpingectomy Bilateral 11/13/2012    Procedure: BILATERAL SALPINGECTOMY;  Surgeon: Emily Filbert, MD;  Location: [facility];  Service: Gynecology;  Laterality: Bilateral;  . Cystoscopy N/A 11/13/2012    Procedure: CYSTOSCOPY;  Surgeon: Emily Filbert, MD;  Location: [facility];  Service: Gynecology;  Laterality: N/A;   Family History  Problem Relation Age of Onset  . Hypertension Father   . Hypertension Mother   . Diabetes Mother   . Hypertension Brother   . Sarcoidosis Brother   . Kidney disease Brother   . Hypertension Brother   . Diabetes Brother    History  Substance Use  Topics  . Smoking status: Current Every Day Smoker -- 0.25 packs/day  . Smokeless tobacco: Not on file  . Alcohol Use: 0.5 oz/week    1 drink(s) per week     Comment: socially   OB History   Grav Para Term Preterm Abortions TAB SAB Ect Mult Living   3 2 1 1 1  0 1 0 0 2     Review of Systems  Gastrointestinal: Negative for nausea, vomiting and diarrhea.  Skin: Positive for wound.   A complete 10 system review of systems was obtained and all systems are negative except as noted in the HPI and PMH.   Allergies  Review of patient's allergies indicates no known allergies.  Home Medications   Current Outpatient Rx  Name  Route  Sig  Dispense  Refill  . cephALEXin (KEFLEX) 500 MG capsule   Oral   Take 1 capsule (500 mg total) by mouth 4 (four) times daily.   28 capsule   0   . HYDROcodone-acetaminophen (NORCO/VICODIN) 5-325 MG per tablet   Oral   Take 1 tablet by mouth every 4 (four) hours as needed.   20 tablet   0    Triage vitals: BP 134/88  Pulse 86  Temp(Src) 98 F (36.7 C) (Oral)  Resp 18  Ht 5\' 3"  (1.6 m)  Wt 240 lb (108.863 kg)  BMI 42.52 kg/m2  SpO2 99%  LMP 11/04/2012  Physical Exam  Nursing note and vitals reviewed. Constitutional: She is oriented to person, place, and time. She appears well-developed and well-nourished. No distress.  HENT:  Head: Normocephalic and atraumatic.  Eyes: EOM are normal.  Neck: Neck supple. No tracheal deviation present.  Cardiovascular: Normal rate.   Pulmonary/Chest: Effort normal. No respiratory distress.  Musculoskeletal: Normal range of motion.  Neurological: She is alert and oriented to person, place, and time.  Skin: Skin is warm and dry.  2 lacerations on the anterior left lower extremities with moderate surrounding erythema and discharge. Two other puncture wounds on the posterior aspect with mild surrounding erythema.   Psychiatric: She has a normal mood and affect. Her behavior is normal.    ED Course   Procedures (including critical care time)  DIAGNOSTIC STUDIES: Oxygen Saturation is 99% on RA, normal by my interpretation.    COORDINATION OF CARE: 10:13 PM- Pt advised of plan for treatment and pt agrees.  Labs Review Labs Reviewed - No data to display Imaging Review No results found.  SUTURE REMOVAL Performed by: Montine Circle  Consent: Verbal consent obtained. Consent given by: patient Required items: required blood products, implants, devices, and special equipment available Time out: Immediately prior to procedure a "time out" was called to verify the correct patient, procedure, equipment, support staff and site/side marked as required.  Location: Left leg  Wound Appearance: clean  Sutures/Staples Removed: 3  Patient tolerance: Patient tolerated the procedure well with no immediate complications.     EKG Interpretation   None       MDM   1. Wound infection     Patient with recent dog bite, the wound is becoming infected. Patient was unable to feel antibiotics. I will give the patient a run of Unasyn in the ED, and discharged with Augmentin. Discontinue Keflex. Discussed patient with Dr. Alvino Chapel, who agrees with plan. Sutures removed to allow for drainage.  Strict return precautions are given.  Skin area marked.  Patient took multiple photos.  I personally performed the services described in this documentation, which was scribed in my presence. The recorded information has been reviewed and is accurate.    Montine Circle, PA-C 06/15/13 0126

## 2013-06-14 NOTE — ED Notes (Signed)
Pt states she was bit by a dog on Friday and came here  Pt received stitches  Pt states she worked today and this evening she developed redness, swelling, and drainage from the bite

## 2013-06-14 NOTE — ED Notes (Signed)
Left lower leg inflamed, sutures intact, swelling, and redness noted. Drainage noted as well. Pt states that this morning only small amount of redness to area and by 6 p.m. More redness and swelling occurred.

## 2013-06-15 MED ORDER — AMOXICILLIN-POT CLAVULANATE 875-125 MG PO TABS: 1.0000 | ORAL_TABLET | Freq: Two times a day (BID) | ORAL | Status: DC

## 2013-06-15 NOTE — ED Notes (Signed)
Patient is alert and oriented x3.  She was given DC instructions and follow up visit instructions.  Patient gave verbal understanding. She was DC ambulatory under her own power to home.  V/S stable.  He was not showing any signs of distress on DC 

## 2013-06-15 NOTE — Discharge Instructions (Signed)
Wound Infection  A wound infection happens when a type of germ (bacteria) starts growing in the wound. In some cases, this can cause the wound to break open. If cared for properly, the infected wound will heal from the inside to the outside. Wound infections need treatment.  CAUSES  An infection is caused by bacteria growing in the wound.   SYMPTOMS    Increase in redness, swelling, or pain at the wound site.   Increase in drainage at the wound site.   Wound or bandage (dressing) starts to smell bad.   Fever.   Feeling tired or fatigued.   Pus draining from the wound.  TREATMENT   You caregiver will prescribe antibiotic medicine. The wound infection should improve within 24 to 48 hours. Any redness around the wound should stop spreading and the wound should be less painful.   HOME CARE INSTRUCTIONS    Only take over-the-counter or prescription medicines for pain, discomfort, or fever as directed by your caregiver.   Take your antibiotics as directed. Finish them even if you start to feel better.   Gently wash the area with mild soap and water 2 times a day, or as directed. Rinse off the soap. Pat the area dry with a clean towel. Do not rub the wound. This may cause bleeding.   Follow your caregiver's instructions for how often you need to change the dressing.   Apply ointment and a dressing to the wound as directed.   If the dressing sticks, moisten it with soapy water and gently remove it.   Change the bandage right away if it becomes wet, dirty, or develops a bad smell.   Take showers. Do not take tub baths, swim, or do anything that may soak the wound until it is healed.   Avoid exercises that make you sweat heavily.   Use anti-itch medicine as directed by your caregiver. The wound may itch when it is healing. Do not pick or scratch at the wound.   Follow up with your caregiver to get your wound rechecked as directed.  SEEK MEDICAL CARE IF:   You have an increase in swelling, pain, or redness  around the wound.   You have an increase in the amount of pus coming from the wound.   There is a bad smell coming from the wound.   More of the wound breaks open.   You have a fever.  MAKE SURE YOU:    Understand these instructions.   Will watch your condition.   Will get help right away if you are not doing well or get worse.  Document Released: 02/09/2003 Document Revised: 08/05/2011 Document Reviewed: 09/16/2010  Exit[employer] Patient Information 2014 [employer].

## 2013-06-16 NOTE — ED Provider Notes (Signed)
Medical screening examination/treatment/procedure(s) were performed by non-physician practitioner and as supervising physician I was immediately available for consultation/collaboration.  EKG Interpretation   None        Jasper Riling. Alvino Chapel, MD 06/16/13 1454

## 2013-08-11 ENCOUNTER — Other Ambulatory Visit (INDEPENDENT_AMBULATORY_CARE_PROVIDER_SITE_OTHER): Payer: Self-pay

## 2013-08-11 DIAGNOSIS — R300 Dysuria: Secondary | ICD-10-CM

## 2013-08-11 LAB — POCT URINALYSIS DIP (DEVICE)
GLUCOSE, UA: NEGATIVE mg/dL
NITRITE: NEGATIVE
Protein, ur: NEGATIVE mg/dL
SPECIFIC GRAVITY, URINE: 1.025 (ref 1.005–1.030)
Urobilinogen, UA: 2 mg/dL — ABNORMAL HIGH (ref 0.0–1.0)
pH: 5.5 (ref 5.0–8.0)

## 2013-08-11 NOTE — Progress Notes (Signed)
Pt. Came in today to give a urine sample as she believes she has a UTI. Urinalysis done and specimen sent for culture. Pt. Informed that if there is anything abnormal we will call her with results.

## 2013-08-12 LAB — URINE CULTURE: Colony Count: >=100000

## 2013-09-15 ENCOUNTER — Encounter: Payer: Self-pay | Admitting: Obstetrics & Gynecology

## 2013-09-15 ENCOUNTER — Ambulatory Visit (INDEPENDENT_AMBULATORY_CARE_PROVIDER_SITE_OTHER): Payer: Self-pay | Admitting: Obstetrics & Gynecology

## 2013-09-15 VITALS — BP 137/70 | HR 88 | Temp 97.9°F | Ht 62.0 in | Wt 234.4 lb

## 2013-09-15 DIAGNOSIS — N9489 Other specified conditions associated with female genital organs and menstrual cycle: Secondary | ICD-10-CM

## 2013-09-15 DIAGNOSIS — N898 Other specified noninflammatory disorders of vagina: Secondary | ICD-10-CM

## 2013-09-15 NOTE — Progress Notes (Signed)
   Subjective:    Patient ID: April Wolfe, female    DOB: Oct 05, 1980, 33 y.o.   MRN: [id number]  HPI 33 yo lady s/p RATH 6/14 who is here today with what she believes are recurrent UTIs. She had a culture done 3/15 which showed multiple organisms but none predominant. She says that she has had 2 other occasions of UTI symptoms. She has used OTC Azo and water with relief. She was on abx for a dog bite during the time that she had these symptoms.  Her second issue is that of vaginal dryness with sex. She thinks that her mom went through menopause late 79s. Her 19 yo sister has not gone through menopause. She has decreased libido.   Review of Systems     Objective:   Physical Exam        Assessment & Plan:  Recurrent UTI symptoms- She has been instructed to come back to the clinic when she gets the symptoms again. She can leave a specimen without being seen so we can culture it.  Vaginal dryness-check TSH Decreased libido- prescribe testosterone 2%

## 2013-09-16 LAB — TSH: TSH: 1.016 u[IU]/mL (ref 0.350–4.500)

## 2013-09-16 LAB — FOLLICLE STIMULATING HORMONE: FSH: 4.5 m[IU]/mL

## 2013-10-27 ENCOUNTER — Encounter: Payer: Self-pay | Admitting: *Deleted

## 2013-10-27 ENCOUNTER — Ambulatory Visit: Payer: Self-pay | Admitting: Obstetrics & Gynecology

## 2014-02-27 ENCOUNTER — Emergency Department (HOSPITAL_COMMUNITY)
Admission: EM | Admit: 2014-02-27 | Discharge: 2014-02-27 | Disposition: A | Discharge status: Home / Self Care | Payer: Self-pay | Attending: Emergency Medicine | Admitting: Emergency Medicine

## 2014-02-27 ENCOUNTER — Encounter (HOSPITAL_COMMUNITY): Payer: Self-pay | Admitting: Emergency Medicine

## 2014-02-27 DIAGNOSIS — M5442 Lumbago with sciatica, left side: Secondary | ICD-10-CM

## 2014-02-27 DIAGNOSIS — Z720 Tobacco use: Secondary | ICD-10-CM | POA: Insufficient documentation

## 2014-02-27 DIAGNOSIS — M5441 Lumbago with sciatica, right side: Secondary | ICD-10-CM

## 2014-02-27 DIAGNOSIS — Z8742 Personal history of other diseases of the female genital tract: Secondary | ICD-10-CM | POA: Insufficient documentation

## 2014-02-27 DIAGNOSIS — M5440 Lumbago with sciatica, unspecified side: Secondary | ICD-10-CM | POA: Insufficient documentation

## 2014-02-27 DIAGNOSIS — E669 Obesity, unspecified: Secondary | ICD-10-CM | POA: Insufficient documentation

## 2014-02-27 DIAGNOSIS — Z8619 Personal history of other infectious and parasitic diseases: Secondary | ICD-10-CM | POA: Insufficient documentation

## 2014-02-27 MED ORDER — METHOCARBAMOL 500 MG PO TABS: 1000.0000 mg | ORAL_TABLET | Freq: Four times a day (QID) | ORAL | Status: DC

## 2014-02-27 MED ORDER — HYDROCODONE-ACETAMINOPHEN 5-325 MG PO TABS: ORAL_TABLET | ORAL | Status: DC

## 2014-02-27 MED ORDER — IBUPROFEN 600 MG PO TABS: 600.0000 mg | ORAL_TABLET | Freq: Four times a day (QID) | ORAL | Status: DC | PRN

## 2014-02-27 NOTE — ED Provider Notes (Signed)
CSN: [id number]     Arrival date & time 02/27/14  1511 History  This chart was scribed for Alecia Lemming, PA-C, working with Babette Relic, MD by Marti Sleigh, ED Scribe. This patient was seen in room WTR7/WTR7 and the patient's care was started at 3:54 PM.    Chief Complaint  Patient presents with  . Back Pain    low back pain x 1 month   The history is provided by the patient. No language interpreter was used.   HPI Comments: April Wolfe is a 33 y.o. female with a family hx of degenerative back disease (father, brother) who presents to the Emergency Department complaining of sharp gradually increasing lower back pain that has continued for the last month. Pt states that the pain radiates down her bilateral legs and into her gluteus. Pt states that she has a past hx of sciatica when she was pregnant, and states that the pain feels similar to that. Pt denies leg weakness, numbness, neck pain, fever, HA, IVDU, abnormal BM, urinary hesitancy, recent hospitalizations.  Pt states that she has been taking advil without relief.  Past Medical History  Diagnosis Date  . No pertinent past medical history   . Fibroids   . DUB (dysfunctional uterine bleeding)   . Obesity   . Chlamydia 04/16/12  . Gonorrhea 04/16/12   Past Surgical History  Procedure Laterality Date  . Tonsillectomy    . Robotic assisted total hysterectomy N/A 11/13/2012    Procedure: ROBOTIC ASSISTED TOTAL HYSTERECTOMY;  Surgeon: Emily Filbert, MD;  Location: [facility];  Service: Gynecology;  Laterality: N/A;  . Bilateral salpingectomy Bilateral 11/13/2012    Procedure: BILATERAL SALPINGECTOMY;  Surgeon: Emily Filbert, MD;  Location: [facility];  Service: Gynecology;  Laterality: Bilateral;  . Cystoscopy N/A 11/13/2012    Procedure: CYSTOSCOPY;  Surgeon: Emily Filbert, MD;  Location: [facility];  Service: Gynecology;  Laterality: N/A;   Family History  Problem Relation Age of Onset  . Hypertension Father   . Hypertension Mother   . Diabetes  Mother   . Hypertension Brother   . Sarcoidosis Brother   . Kidney disease Brother   . Hypertension Brother   . Diabetes Brother    History  Substance Use Topics  . Smoking status: Current Every Day Smoker -- 0.25 packs/day  . Smokeless tobacco: Not on file  . Alcohol Use: 0.5 oz/week    1 drink(s) per week     Comment: socially   OB History   Grav Para Term Preterm Abortions TAB SAB Ect Mult Living   3 2 1 1 1  0 1 0 0 2     Review of Systems  Constitutional: Negative for fever and unexpected weight change.  Gastrointestinal: Negative for constipation.       Negative for fecal incontinence.   Genitourinary: Negative for dysuria, hematuria, flank pain, vaginal bleeding, vaginal discharge and pelvic pain.       Negative for urinary incontinence or retention.  Musculoskeletal: Positive for back pain. Negative for neck pain.  Neurological: Negative for weakness, numbness and headaches.       Denies saddle paresthesias.    Allergies  Review of patient's allergies indicates no known allergies.  Home Medications   Prior to Admission medications   Medication Sig Start Date End Date Taking? Authorizing Provider  ibuprofen (ADVIL,MOTRIN) 200 MG tablet Take 400 mg by mouth every 6 (six) hours as needed for moderate pain.    Yes Historical Provider, MD  BP 122/75  Pulse 76  Temp(Src) 97.8 F (36.6 C) (Oral)  Resp 18  SpO2 96%  LMP 11/04/2012  Physical Exam  Nursing note and vitals reviewed. Constitutional: She is oriented to person, place, and time. She appears well-developed and well-nourished.  HENT:  Head: Normocephalic and atraumatic.  Eyes: Conjunctivae and EOM are normal. Pupils are equal, round, and reactive to light.  Neck: Normal range of motion. Neck supple.  Cardiovascular: Normal rate, regular rhythm and normal heart sounds.   Pulmonary/Chest: Effort normal and breath sounds normal.  Abdominal: Soft. Bowel sounds are normal. There is no tenderness. There is  no CVA tenderness.  Musculoskeletal: Normal range of motion.       Cervical back: She exhibits normal range of motion, no tenderness and no bony tenderness.       Thoracic back: She exhibits normal range of motion, no tenderness and no bony tenderness.       Lumbar back: She exhibits tenderness. She exhibits normal range of motion and no bony tenderness.       Back:  No step-off noted with palpation of spine.   Neurological: She is alert and oriented to person, place, and time. She has normal strength and normal reflexes. No sensory deficit.  5/5 strength in entire lower extremities bilaterally. No sensation deficit.   Skin: Skin is warm and dry. No rash noted.  Psychiatric: She has a normal mood and affect. Her behavior is normal.    ED Course  Procedures  DIAGNOSTIC STUDIES: Oxygen Saturation is 96% on RA, normal by my interpretation.    COORDINATION OF CARE: 4:02 PM Discussed treatment plan with pt at bedside and pt agreed to plan.  Labs Review Labs Reviewed - No data to display  Imaging Review No results found.   EKG Interpretation None      4:09 PM Patient seen and examined.   Vital signs reviewed and are as follows: Filed Vitals:   02/27/14 1516  BP: 122/75  Pulse: 76  Temp: 97.8 F (36.6 C)  Resp: 18    No red flag s/s of low back pain. Patient was counseled on back pain precautions and told to do activity as tolerated but do not lift, push, or pull heavy objects more than 10 pounds for the next week.  Patient counseled to use ice or heat on back for no longer than 15 minutes every hour.   Patient prescribed muscle relaxer and counseled on proper use of muscle relaxant medication.    Patient prescribed narcotic pain medicine and counseled on proper use of narcotic pain medications. Counseled not to combine this medication with others containing tylenol.   Urged patient not to drink alcohol, drive, or perform any other activities that requires focus while  taking either of these medications.  Patient urged to follow-up with PCP if pain does not improve with treatment and rest or if pain becomes recurrent. Urged to return with worsening severe pain, loss of bowel or bladder control, trouble walking. PCP referrals given.   The patient verbalizes understanding and agrees with the plan.   MDM   Final diagnoses:  Bilateral low back pain with sciatica, sciatica laterality unspecified   Patient with back pain. No neurological deficits. Patient is ambulatory. No warning symptoms of back pain including: fecal incontinence, urinary retention or overflow incontinence, night sweats, waking from sleep with back pain, unexplained fevers or weight loss, h/o cancer, IVDU, recent trauma. No concern for cauda equina, epidural abscess, or other serious cause  of back pain. Conservative measures such as rest, ice/heat and pain medicine indicated with PCP follow-up if no improvement with conservative management.   I personally performed the services described in this documentation, which was scribed in my presence. The recorded information has been reviewed and is accurate.      Carlisle Cater, PA-C 02/27/14 1610

## 2014-02-27 NOTE — Discharge Instructions (Signed)
Please read and follow all provided instructions.  Your diagnoses today include:  1. Bilateral low back pain with sciatica, sciatica laterality unspecified     Tests performed today include:  Vital signs - see below for your results today  Medications prescribed:   Vicodin (hydrocodone/acetaminophen) - narcotic pain medication  DO NOT drive or perform any activities that require you to be awake and alert because this medicine can make you drowsy. BE VERY CAREFUL not to take multiple medicines containing Tylenol (also called acetaminophen). Doing so can lead to an overdose which can damage your liver and cause liver failure and possibly death.   Ibuprofen (Motrin, Advil) - anti-inflammatory pain medication  Do not exceed 600mg  ibuprofen every 6 hours, take with food  You have been prescribed an anti-inflammatory medication or NSAID. Take with food. Take smallest effective dose for the shortest duration needed for your pain. Stop taking if you experience stomach pain or vomiting.    Robaxin (methocarbamol) - muscle relaxer medication  DO NOT drive or perform any activities that require you to be awake and alert because this medicine can make you drowsy.   Take any prescribed medications only as directed.  Home care instructions:   Follow any educational materials contained in this packet  Please rest, use ice or heat on your back for the next several days  Do not lift, push, pull anything more than 10 pounds for the next week  Follow-up instructions: Please follow-up with your primary care provider in the next 1 week for further evaluation of your symptoms.   Return instructions:  SEEK IMMEDIATE MEDICAL ATTENTION IF YOU HAVE:  New numbness, tingling, weakness, or problem with the use of your arms or legs  Severe back pain not relieved with medications  Loss control of your bowels or bladder  Increasing pain in any areas of the body (such as chest or abdominal  pain)  Shortness of breath, dizziness, or fainting.   Worsening nausea (feeling sick to your stomach), vomiting, fever, or sweats  Any other emergent concerns regarding your health   Additional Information:  Your vital signs today were: BP 122/75   Pulse 76   Temp(Src) 97.8 F (36.6 C) (Oral)   Resp 18   SpO2 96%   LMP 11/04/2012 If your blood pressure (BP) was elevated above 135/85 this visit, please have this repeated by your doctor within one month. --------------

## 2014-02-27 NOTE — ED Notes (Signed)
Pt leaving with ALL belongings she arrived with . She is ambulatory upon discharge.

## 2014-02-27 NOTE — ED Notes (Signed)
Pt reports one m,onth hx of recurrent back and leg pain,radiating down buttocks to legs. Tx with 2 dosages of Advil-no relief of sx

## 2014-02-28 NOTE — ED Provider Notes (Signed)
Medical screening examination/treatment/procedure(s) were performed by non-physician practitioner and as supervising physician I was immediately available for consultation/collaboration.   EKG Interpretation None       Babette Relic, MD 02/28/14 [phone]

## 2014-03-28 ENCOUNTER — Encounter (HOSPITAL_COMMUNITY): Payer: Self-pay | Admitting: Emergency Medicine

## 2014-05-10 ENCOUNTER — Encounter (HOSPITAL_COMMUNITY): Payer: Self-pay | Admitting: Emergency Medicine

## 2014-05-10 ENCOUNTER — Emergency Department (HOSPITAL_COMMUNITY)
Admission: EM | Admit: 2014-05-10 | Discharge: 2014-05-10 | Disposition: A | Discharge status: Home / Self Care | Payer: Self-pay | Attending: Emergency Medicine | Admitting: Emergency Medicine

## 2014-05-10 DIAGNOSIS — Z79899 Other long term (current) drug therapy: Secondary | ICD-10-CM | POA: Insufficient documentation

## 2014-05-10 DIAGNOSIS — Z720 Tobacco use: Secondary | ICD-10-CM | POA: Insufficient documentation

## 2014-05-10 DIAGNOSIS — E669 Obesity, unspecified: Secondary | ICD-10-CM | POA: Insufficient documentation

## 2014-05-10 DIAGNOSIS — G8929 Other chronic pain: Secondary | ICD-10-CM | POA: Insufficient documentation

## 2014-05-10 DIAGNOSIS — M545 Low back pain, unspecified: Secondary | ICD-10-CM

## 2014-05-10 DIAGNOSIS — Z8742 Personal history of other diseases of the female genital tract: Secondary | ICD-10-CM | POA: Insufficient documentation

## 2014-05-10 DIAGNOSIS — Z87440 Personal history of urinary (tract) infections: Secondary | ICD-10-CM | POA: Insufficient documentation

## 2014-05-10 DIAGNOSIS — Z8619 Personal history of other infectious and parasitic diseases: Secondary | ICD-10-CM | POA: Insufficient documentation

## 2014-05-10 MED ORDER — IBUPROFEN 600 MG PO TABS: 600.0000 mg | ORAL_TABLET | Freq: Four times a day (QID) | ORAL | Status: DC | PRN

## 2014-05-10 MED ORDER — OXYCODONE-ACETAMINOPHEN 5-325 MG PO TABS: 1.0000 | ORAL_TABLET | ORAL | Status: DC | PRN

## 2014-05-10 MED ORDER — NAPROXEN 500 MG PO TABS: 500.0000 mg | ORAL_TABLET | Freq: Two times a day (BID) | ORAL | Status: DC

## 2014-05-10 NOTE — ED Notes (Signed)
Pt c/o of flare-up of chronic back pain. Denies loss of control of bowel or bladder.

## 2014-05-10 NOTE — Discharge Instructions (Signed)
SEEK IMMEDIATE MEDICAL ATTENTION IF: °New numbness, tingling, weakness, or problem with the use of your arms or legs.  °Severe back pain not relieved with medications.  °Change in bowel or bladder control.  °Increasing pain in any areas of the body (such as chest or abdominal pain).  °Shortness of breath, dizziness or fainting.  °Nausea (feeling sick to your stomach), vomiting, fever, or sweats. ° °Back Pain, Adult °Low back pain is very common. About 1 in 5 people have back pain. The cause of low back pain is rarely dangerous. The pain often gets better over time. About half of people with a sudden onset of back pain feel better in just 2 weeks. About 8 in 10 people feel better by 6 weeks.  °CAUSES °Some common causes of back pain include: °· Strain of the muscles or ligaments supporting the spine. °· Wear and tear (degeneration) of the spinal discs. °· Arthritis. °· Direct injury to the back. °DIAGNOSIS °Most of the time, the direct cause of low back pain is not known. However, back pain can be treated effectively even when the exact cause of the pain is unknown. Answering your caregiver's questions about your overall health and symptoms is one of the most accurate ways to make sure the cause of your pain is not dangerous. If your caregiver needs more information, he or she may order lab work or imaging tests (X-rays or MRIs). However, even if imaging tests show changes in your back, this usually does not require surgery. °HOME CARE INSTRUCTIONS °For many people, back pain returns. Since low back pain is rarely dangerous, it is often a condition that people can learn to manage on their own.  °· Remain active. It is stressful on the back to sit or stand in one place. Do not sit, drive, or stand in one place for more than 30 minutes at a time. Take short walks on level surfaces as soon as pain allows. Try to increase the length of time you walk each day. °· Do not stay in bed. Resting more than 1 or 2 days can delay  your recovery. °· Do not avoid exercise or work. Your body is made to move. It is not dangerous to be active, even though your back may hurt. Your back will likely heal faster if you return to being active before your pain is gone. °· Pay attention to your body when you  bend and lift. Many people have less discomfort when lifting if they bend their knees, keep the load close to their bodies, and avoid twisting. Often, the most comfortable positions are those that put less stress on your recovering back. °· Find a comfortable position to sleep. Use a firm mattress and lie on your side with your knees slightly bent. If you lie on your back, put a pillow under your knees. °· Only take over-the-counter or prescription medicines as directed by your caregiver. Over-the-counter medicines to reduce pain and inflammation are often the most helpful. Your caregiver may prescribe muscle relaxant drugs. These medicines help dull your pain so you can more quickly return to your normal activities and healthy exercise. °· Put ice on the injured area. °¨ Put ice in a plastic bag. °¨ Place a towel between your skin and the bag. °¨ Leave the ice on for 15-20 minutes, 03-04 times a day for the first 2 to 3 days. After that, ice and heat may be alternated to reduce pain and spasms. °· Ask your caregiver about trying back exercises and gentle massage. This may be of some benefit. °· Avoid feeling anxious or stressed. Stress   increases muscle tension and can worsen back pain. It is important to recognize when you are anxious or stressed and learn ways to manage it. Exercise is a great option. °SEEK MEDICAL CARE IF: °· You have pain that is not relieved with rest or medicine. °· You have pain that does not improve in 1 week. °· You have new symptoms. °· You are generally not feeling well. °SEEK IMMEDIATE MEDICAL CARE IF:  °· You have pain that radiates from your back into your legs. °· You develop new bowel or bladder control  problems. °· You have unusual weakness or numbness in your arms or legs. °· You develop nausea or vomiting. °· You develop abdominal pain. °· You feel faint. °Document Released: 05/13/2005 Document Revised: 11/12/2011 Document Reviewed: 09/14/2013 °ExitCare® Patient Information ©2015 [employer]. This information is not intended to replace advice given to you by your health care provider. Make sure you discuss any questions you have with your health care provider. ° °

## 2014-05-10 NOTE — ED Provider Notes (Signed)
CSN: [id number]     Arrival date & time 05/10/14  1347 History  This chart was scribed for Margarita Mail, PA-C, working with Malvin Johns, MD by Marti Sleigh, ED Scribe. This patient was seen in room WTR6/WTR6 and the patient's care was started at 2:09 PM.     Chief Complaint  Patient presents with  . Back Pain    HPI  HPI Comments: April Wolfe is a 33 y.o. female with a past hx of hysterectomy who presents to the Emergency Department complaining of worsening non-radiating lower back pain that started at 7AM this morning. Pt states she has chronic lower back pain, but this is significantly worse than baseline. Pt states she took two advil this morning without relief. Pt states she had a past hx of UTIs, but states that this pain is different. Pt states her pain has been this bad once before and it was severe lower back pain at that time. Pt denies loss of bowel or bladder, or weakness in legs. Pt denies any other urinary symptoms.   Past Medical History  Diagnosis Date  . No pertinent past medical history   . Fibroids   . DUB (dysfunctional uterine bleeding)   . Obesity   . Chlamydia 04/16/12  . Gonorrhea 04/16/12   Past Surgical History  Procedure Laterality Date  . Tonsillectomy    . Robotic assisted total hysterectomy N/A 11/13/2012    Procedure: ROBOTIC ASSISTED TOTAL HYSTERECTOMY;  Surgeon: Emily Filbert, MD;  Location: [facility];  Service: Gynecology;  Laterality: N/A;  . Bilateral salpingectomy Bilateral 11/13/2012    Procedure: BILATERAL SALPINGECTOMY;  Surgeon: Emily Filbert, MD;  Location: [facility];  Service: Gynecology;  Laterality: Bilateral;  . Cystoscopy N/A 11/13/2012    Procedure: CYSTOSCOPY;  Surgeon: Emily Filbert, MD;  Location: [facility];  Service: Gynecology;  Laterality: N/A;   Family History  Problem Relation Age of Onset  . Hypertension Father   . Hypertension Mother   . Diabetes Mother   . Hypertension Brother   . Sarcoidosis Brother   . Kidney disease Brother    . Hypertension Brother   . Diabetes Brother    History  Substance Use Topics  . Smoking status: Current Every Day Smoker -- 0.25 packs/day  . Smokeless tobacco: Not on file  . Alcohol Use: 0.5 oz/week    1 drink(s) per week     Comment: socially   OB History    Gravida Para Term Preterm AB TAB SAB Ectopic Multiple Living   3 2 1 1 1  0 1 0 0 2     Review of Systems  Constitutional: Negative for fever and chills.  Genitourinary: Negative for urgency, frequency and difficulty urinating.  Musculoskeletal: Positive for back pain.  Neurological: Negative for weakness.      Allergies  Review of patient's allergies indicates no known allergies.  Home Medications   Prior to Admission medications   Medication Sig Start Date End Date Taking? Authorizing Provider  ibuprofen (ADVIL,MOTRIN) 200 MG tablet Take 400 mg by mouth every 6 (six) hours as needed for moderate pain.   Yes Historical Provider, MD  HYDROcodone-acetaminophen (NORCO/VICODIN) 5-325 MG per tablet Take 1-2 tablets every 6 hours as needed for severe pain Patient not taking: Reported on 05/10/2014 02/27/14   Carlisle Cater, PA-C  ibuprofen (ADVIL,MOTRIN) 600 MG tablet Take 1 tablet (600 mg total) by mouth every 6 (six) hours as needed. Patient not taking: Reported on 05/10/2014 02/27/14   Carlisle Cater,  PA-C  methocarbamol (ROBAXIN) 500 MG tablet Take 2 tablets (1,000 mg total) by mouth 4 (four) times daily. Patient not taking: Reported on 05/10/2014 02/27/14   Carlisle Cater, PA-C   BP 141/79 mmHg  Pulse 88  Temp(Src) 98.1 F (36.7 C) (Oral)  Resp 18  SpO2 98%  LMP 11/04/2012 Physical Exam  Constitutional: She is oriented to person, place, and time. She appears well-developed and well-nourished.  HENT:  Head: Normocephalic and atraumatic.  Eyes: Pupils are equal, round, and reactive to light.  Neck: Neck supple.  Cardiovascular: Normal rate and regular rhythm.   Pulmonary/Chest: Effort normal and breath sounds  normal. No respiratory distress.  Abdominal:  No CVA tenderness, or suprapubic tenderness.   Musculoskeletal: She exhibits tenderness.  Lumbar paraspinal tenderness.   Neurological: She is alert and oriented to person, place, and time.  Skin: Skin is warm and dry.  Psychiatric: She has a normal mood and affect. Her behavior is normal.  Nursing note and vitals reviewed.   ED Course  Procedures  DIAGNOSTIC STUDIES: Oxygen Saturation is 98% on RA, normal by my interpretation.    COORDINATION OF CARE: 2:14 PM Discussed treatment plan with pt at bedside and pt agreed to plan.  Labs Review Labs Reviewed - No data to display  Imaging Review No results found.   EKG Interpretation None      MDM   Final diagnoses:  Bilateral low back pain without sciatica   Patient with back pain.  No neurological deficits and normal neuro exam.  Patient can walk but states is painful.  No loss of bowel or bladder control.  No concern for cauda equina.  No fever, night sweats, weight loss, h/o cancer, IVDU.  RICE protocol and pain medicine indicated and discussed with patient.   I personally performed the services described in this documentation, which was scribed in my presence. The recorded information has been reviewed and is accurate.      Margarita Mail, PA-C 05/10/14 [place], MD 05/13/14 1501

## 2014-05-25 ENCOUNTER — Emergency Department (HOSPITAL_COMMUNITY)
Admission: EM | Admit: 2014-05-25 | Discharge: 2014-05-25 | Disposition: A | Discharge status: Home / Self Care | Payer: Self-pay | Attending: Emergency Medicine | Admitting: Emergency Medicine

## 2014-05-25 ENCOUNTER — Encounter (HOSPITAL_COMMUNITY): Payer: Self-pay | Admitting: Emergency Medicine

## 2014-05-25 DIAGNOSIS — Z720 Tobacco use: Secondary | ICD-10-CM | POA: Insufficient documentation

## 2014-05-25 DIAGNOSIS — M5441 Lumbago with sciatica, right side: Secondary | ICD-10-CM | POA: Insufficient documentation

## 2014-05-25 DIAGNOSIS — E669 Obesity, unspecified: Secondary | ICD-10-CM | POA: Insufficient documentation

## 2014-05-25 DIAGNOSIS — Z791 Long term (current) use of non-steroidal anti-inflammatories (NSAID): Secondary | ICD-10-CM | POA: Insufficient documentation

## 2014-05-25 DIAGNOSIS — Z8742 Personal history of other diseases of the female genital tract: Secondary | ICD-10-CM | POA: Insufficient documentation

## 2014-05-25 DIAGNOSIS — Z8619 Personal history of other infectious and parasitic diseases: Secondary | ICD-10-CM | POA: Insufficient documentation

## 2014-05-25 MED ORDER — MELOXICAM 7.5 MG PO TABS: 7.5000 mg | ORAL_TABLET | Freq: Every day | ORAL | Status: DC

## 2014-05-25 MED ORDER — PREDNISONE 20 MG PO TABS: ORAL_TABLET | ORAL | Status: DC

## 2014-05-25 MED ORDER — METHOCARBAMOL 500 MG PO TABS: 1000.0000 mg | ORAL_TABLET | Freq: Four times a day (QID) | ORAL | Status: DC

## 2014-05-25 MED ORDER — TRAMADOL HCL 50 MG PO TABS: 50.0000 mg | ORAL_TABLET | Freq: Four times a day (QID) | ORAL | Status: DC | PRN

## 2014-05-25 NOTE — ED Notes (Signed)
Pt c/o lower back pain since middle of December, pt states that pain now radiating down her right leg.

## 2014-05-25 NOTE — ED Provider Notes (Signed)
CSN: [id number]     Arrival date & time 05/25/14  1140 History  This chart was scribed for non-physician practitioner, Noland Fordyce, PA-C, working with Ephraim Hamburger, MD, by Ian Bushman, ED Scribe. This patient was seen in room WTR8/WTR8 and the patient's care was started at 12:45 PM.   Chief Complaint  Patient presents with  . Back Pain  . Leg Pain     (Consider location/radiation/quality/duration/timing/severity/associated sxs/prior Treatment) The history is provided by the patient. No language interpreter was used.    HPI Comments: April Wolfe is a 33 y.o. female who presents to the Emergency Department complaining of  Lower back pain which is radiating down to her right leg . Patient came in the 14th of this month and the pain has been bearable after her visit but today she states that it worsened, 10/10 at worst.  She states the pain in her leg feels "achey" down her right leg and states that it feels like it is going numb. She has been taking a muscle relaxer for the pain and percocet with relief. She denies upper back pain, neck pain, fevers, nausea, vomiting, and diarrhea. Denies change in bowel or bladder habits. Patient does not have a family doctor.   Past Medical History  Diagnosis Date  . No pertinent past medical history   . Fibroids   . DUB (dysfunctional uterine bleeding)   . Obesity   . Chlamydia 04/16/12  . Gonorrhea 04/16/12   Past Surgical History  Procedure Laterality Date  . Tonsillectomy    . Robotic assisted total hysterectomy N/A 11/13/2012    Procedure: ROBOTIC ASSISTED TOTAL HYSTERECTOMY;  Surgeon: Emily Filbert, MD;  Location: [facility];  Service: Gynecology;  Laterality: N/A;  . Bilateral salpingectomy Bilateral 11/13/2012    Procedure: BILATERAL SALPINGECTOMY;  Surgeon: Emily Filbert, MD;  Location: [facility];  Service: Gynecology;  Laterality: Bilateral;  . Cystoscopy N/A 11/13/2012    Procedure: CYSTOSCOPY;  Surgeon: Emily Filbert, MD;  Location: [facility];   Service: Gynecology;  Laterality: N/A;   Family History  Problem Relation Age of Onset  . Hypertension Father   . Hypertension Mother   . Diabetes Mother   . Hypertension Brother   . Sarcoidosis Brother   . Kidney disease Brother   . Hypertension Brother   . Diabetes Brother    History  Substance Use Topics  . Smoking status: Current Every Day Smoker -- 0.25 packs/day  . Smokeless tobacco: Not on file  . Alcohol Use: 0.5 oz/week    1 drink(s) per week     Comment: socially   OB History    Gravida Para Term Preterm AB TAB SAB Ectopic Multiple Living   3 2 1 1 1  0 1 0 0 2     Review of Systems  Constitutional: Negative for fever.  Gastrointestinal: Negative for nausea, vomiting and diarrhea.  Musculoskeletal: Positive for myalgias and back pain.  Neurological: Positive for numbness.      Allergies  Review of patient's allergies indicates no known allergies.  Home Medications   Prior to Admission medications   Medication Sig Start Date End Date Taking? Authorizing Provider  ibuprofen (ADVIL,MOTRIN) 600 MG tablet Take 1 tablet (600 mg total) by mouth every 6 (six) hours as needed. 05/10/14   Margarita Mail, PA-C  meloxicam (MOBIC) 7.5 MG tablet Take 1 tablet (7.5 mg total) by mouth daily. 05/25/14   Noland Fordyce, PA-C  methocarbamol (ROBAXIN) 500 MG tablet Take 2 tablets (  1,000 mg total) by mouth 4 (four) times daily. 05/25/14   Noland Fordyce, PA-C  naproxen (NAPROSYN) 500 MG tablet Take 1 tablet (500 mg total) by mouth 2 (two) times daily with a meal. 05/10/14   Margarita Mail, PA-C  oxyCODONE-acetaminophen (PERCOCET) 5-325 MG per tablet Take 1-2 tablets by mouth every 4 (four) hours as needed. 05/10/14   Margarita Mail, PA-C  predniSONE (DELTASONE) 20 MG tablet 3 tabs po day one, then 2 po daily x 4 days 05/25/14   Noland Fordyce, PA-C  traMADol (ULTRAM) 50 MG tablet Take 1 tablet (50 mg total) by mouth every 6 (six) hours as needed. 05/25/14   Noland Fordyce, PA-C   BP  127/74 mmHg  Pulse 84  Temp(Src) 98.1 F (36.7 C) (Oral)  Resp 18  SpO2 99%  LMP 11/04/2012  Physical Exam  Constitutional: She is oriented to person, place, and time. She appears well-developed and well-nourished.  HENT:  Head: Normocephalic and atraumatic.  Eyes: EOM are normal.  Neck: Normal range of motion.  Cardiovascular: Normal rate.   Pulmonary/Chest: Effort normal.  Musculoskeletal: Normal range of motion. She exhibits tenderness. She exhibits no edema.  No midline spinal tenderness. Tenderness to bilateral lumbar musculature and right lateral thigh. FROM bilateral hips and knees. 5/5 strength in lower extremities bilaterally.  Neurological: She is alert and oriented to person, place, and time. She has normal strength. No sensory deficit.  Reflex Scores:      Patellar reflexes are 2+ on the right side and 2+ on the left side. Skin: Skin is warm and dry. No rash noted. No erythema.  Psychiatric: She has a normal mood and affect. Her behavior is normal.  Nursing note and vitals reviewed.   ED Course  Procedures (including critical care time) DIAGNOSTIC STUDIES: Oxygen Saturation is 99% on RA, normal by my interpretation.    COORDINATION OF CARE: 12:51 PM Discussed treatment plan with patient at beside, the patient agrees with the plan and has no further questions at this time.  Labs Review Labs Reviewed - No data to display  Imaging Review No results found.   EKG Interpretation None      MDM   Final diagnoses:  Midline low back pain with right-sided sciatica   Pt c/o lower back pain radiating down right leg. No new injuries. No red flag symptoms. Pt has been taking percocet for pain. No new symptoms, only worsened pain. Not concerned for cauda equina or spinal abscess. Pain likely muscular in nature.  No imaging indicated at this time. Home care instructions for sciatica provided.  Advised to f/u with PCP, [facility] resource provided. Return precautions provided.  Pt verbalized understanding and agreement with tx plan.   I personally performed the services described in this documentation, which was scribed in my presence. The recorded information has been reviewed and is accurate.     Noland Fordyce, PA-C 05/25/14 [place], MD 05/26/14 [phone]

## 2016-07-20 DIAGNOSIS — H5203 Hypermetropia, bilateral: Secondary | ICD-10-CM | POA: Diagnosis not present

## 2016-10-05 ENCOUNTER — Encounter: Payer: Self-pay | Admitting: Emergency Medicine

## 2016-10-05 ENCOUNTER — Ambulatory Visit (INDEPENDENT_AMBULATORY_CARE_PROVIDER_SITE_OTHER): Payer: 59 | Admitting: Emergency Medicine

## 2016-10-05 VITALS — BP 120/80 | HR 83 | Temp 98.4°F | Resp 18 | Ht 64.0 in | Wt 258.1 lb

## 2016-10-05 DIAGNOSIS — R21 Rash and other nonspecific skin eruption: Secondary | ICD-10-CM | POA: Diagnosis not present

## 2016-10-05 DIAGNOSIS — Q810 Epidermolysis bullosa simplex: Secondary | ICD-10-CM | POA: Insufficient documentation

## 2016-10-05 MED ORDER — PREDNISONE 20 MG PO TABS: 40.0000 mg | ORAL_TABLET | Freq: Every day | ORAL | 1 refills | Status: AC

## 2016-10-05 NOTE — Patient Instructions (Addendum)
IF you received an x-ray today, you will receive an invoice from [facility]. Please contact [facility] at [phone] with questions or concerns regarding your invoice.   IF you received labwork today, you will receive an invoice from [facility]. Please contact [facility] at [phone] with questions or concerns regarding your invoice.   Our billing staff will not be able to assist you with questions regarding bills from these companies.  You will be contacted with the lab results as soon as they are available. The fastest way to get your results is to activate your My Chart account. Instructions are located on the last page of this paperwork. If you have not heard from [place] regarding the results in 2 weeks, please contact this office.      Rash A rash is a change in the color of the skin. A rash can also change the way your skin feels. There are many different conditions and factors that can cause a rash. Follow these instructions at home: Pay attention to any changes in your symptoms. Follow these instructions to help with your condition: Medicine  Take or apply over-the-counter and prescription medicines only as told by your doctor. These may include:  Corticosteroid cream.  Anti-itch lotions.  Oral antihistamines. Skin Care   Put cool compresses on the affected areas.  Try taking a bath with:  Epsom salts. Follow the instructions on the packaging. You can get these at your local pharmacy or grocery store.  Baking soda. Pour a small amount into the bath as told by your doctor.  Colloidal oatmeal. Follow the instructions on the packaging. You can get this at your local pharmacy or grocery store.  Try putting baking soda paste onto your skin. Stir water into baking soda until it gets like a paste.  Do not scratch or rub your skin.  Avoid covering the rash. Make sure the rash is exposed to air as much as possible. General instructions   Avoid hot  showers or baths, which can make itching worse. A cold shower may help.  Avoid scented soaps, detergents, and perfumes. Use gentle soaps, detergents, perfumes, and other cosmetic products.  Avoid anything that causes your rash. Keep a journal to help track what causes your rash. Write down:  What you eat.  What cosmetic products you use.  What you drink.  What you wear. This includes jewelry.  Keep all follow-up visits as told by your doctor. This is important. Contact a doctor if:  You sweat at night.  You lose weight.  You pee (urinate) more than normal.  You feel weak.  You throw up (vomit).  Your skin or the whites of your eyes look yellow (jaundice).  Your skin:  Tingles.  Is numb.  Your rash:  Does not go away after a few days.  Gets worse.  You are:  More thirsty than normal.  More tired than normal.  You have:  New symptoms.  Pain in your belly (abdomen).  A fever.  Watery poop (diarrhea). Get help right away if:  Your rash covers all or most of your body. The rash may or may not be painful.  You have blisters that:  Are on top of the rash.  Grow larger.  Grow together.  Are painful.  Are inside your nose or mouth.  You have a rash that:  Looks like purple pinprick-sized spots all over your body.  Has a "bull's eye" or looks like a target.  Is red  and painful, causes your skin to peel, and is not from being in the sun too long. This information is not intended to replace advice given to you by your health care provider. Make sure you discuss any questions you have with your health care provider. Document Released: 10/30/2007 Document Revised: 10/19/2015 Document Reviewed: 09/28/2014 Elsevier Interactive Patient Education  2017 [employer].

## 2016-10-05 NOTE — Progress Notes (Signed)
April Wolfe 36 y.o.   Chief Complaint  Patient presents with  . Sore Throat    x 2 weeks    HISTORY OF PRESENT ILLNESS: This is a 36 y.o. female had a sore throat 2 weeks ago; getting better but she has chronic skin condition exacerbated by recent infection. Denies fever, n/v, or any other significant symptoms.  HPI   Prior to Admission medications   Medication Sig Start Date End Date Taking? Authorizing Provider  ibuprofen (ADVIL,MOTRIN) 600 MG tablet Take 1 tablet (600 mg total) by mouth every 6 (six) hours as needed. Patient not taking: Reported on 10/05/2016 05/10/14   Margarita Mail, PA-C  meloxicam (MOBIC) 7.5 MG tablet Take 1 tablet (7.5 mg total) by mouth daily. Patient not taking: Reported on 10/05/2016 05/25/14   Noland Fordyce, PA-C  methocarbamol (ROBAXIN) 500 MG tablet Take 2 tablets (1,000 mg total) by mouth 4 (four) times daily. Patient not taking: Reported on 10/05/2016 05/25/14   Noland Fordyce, PA-C  naproxen (NAPROSYN) 500 MG tablet Take 1 tablet (500 mg total) by mouth 2 (two) times daily with a meal. Patient not taking: Reported on 10/05/2016 05/10/14   Margarita Mail, PA-C  oxyCODONE-acetaminophen (PERCOCET) 5-325 MG per tablet Take 1-2 tablets by mouth every 4 (four) hours as needed. Patient not taking: Reported on 10/05/2016 05/10/14   Margarita Mail, PA-C  predniSONE (DELTASONE) 20 MG tablet 3 tabs po day one, then 2 po daily x 4 days Patient not taking: Reported on 10/05/2016 05/25/14   Noland Fordyce, PA-C  traMADol (ULTRAM) 50 MG tablet Take 1 tablet (50 mg total) by mouth every 6 (six) hours as needed. Patient not taking: Reported on 10/05/2016 05/25/14   Noland Fordyce, PA-C    No Known Allergies  Patient Active Problem List   Diagnosis Date Noted  . Anemia 11/13/2012  . Leiomyoma of uterus, unspecified 10/03/2011    Past Medical History:  Diagnosis Date  . Chlamydia 04/16/12  . DUB (dysfunctional uterine bleeding)   . Fibroids   .  Gonorrhea 04/16/12  . No pertinent past medical history   . Obesity     Past Surgical History:  Procedure Laterality Date  . BILATERAL SALPINGECTOMY Bilateral 11/13/2012   Procedure: BILATERAL SALPINGECTOMY;  Surgeon: Emily Filbert, MD;  Location: [facility];  Service: Gynecology;  Laterality: Bilateral;  . CYSTOSCOPY N/A 11/13/2012   Procedure: CYSTOSCOPY;  Surgeon: Emily Filbert, MD;  Location: [facility];  Service: Gynecology;  Laterality: N/A;  . ROBOTIC ASSISTED TOTAL HYSTERECTOMY N/A 11/13/2012   Procedure: ROBOTIC ASSISTED TOTAL HYSTERECTOMY;  Surgeon: Emily Filbert, MD;  Location: [facility];  Service: Gynecology;  Laterality: N/A;  . TONSILLECTOMY      Social History   Social History  . Marital status: Married    Spouse name: N/A  . Number of children: N/A  . Years of education: N/A   Occupational History  . Not on file.   Social History Main Topics  . Smoking status: Current Every Day Smoker    Packs/day: 0.25  . Smokeless tobacco: Never Used  . Alcohol use 0.5 oz/week    1 Standard drinks or equivalent per week     Comment: socially  . Drug use: No  . Sexual activity: Not Currently    Birth control/ protection: Surgical   Other Topics Concern  . Not on file   Social History Narrative  . No narrative on file    Family History  Problem Relation Age of Onset  .  Hypertension Father   . Hypertension Mother   . Diabetes Mother   . Hypertension Brother   . Sarcoidosis Brother   . Kidney disease Brother   . Hypertension Brother   . Diabetes Brother      Review of Systems  Constitutional: Negative.  Negative for chills and fever.  HENT: Positive for sore throat (2 weeks ago).   Eyes: Negative.   Respiratory: Negative for cough and shortness of breath.   Cardiovascular: Negative for chest pain, palpitations and leg swelling.  Gastrointestinal: Negative for abdominal pain, diarrhea, nausea and vomiting.  Genitourinary: Negative for dysuria and hematuria.  Musculoskeletal:  Negative for myalgias and neck pain.  Skin: Positive for rash.  Neurological: Negative for dizziness, sensory change, focal weakness and headaches.  Endo/Heme/Allergies: Negative.   All other systems reviewed and are negative.  Vitals:   10/05/16 0923  BP: 120/80  Pulse: 83  Resp: 18  Temp: 98.4 F (36.9 C)     Physical Exam  Constitutional: She is oriented to person, place, and time. She appears well-developed and well-nourished.  HENT:  Head: Normocephalic and atraumatic.  Nose: Nose normal.  Mouth/Throat: Oropharynx is clear and moist. No oropharyngeal exudate.  Eyes: Conjunctivae and EOM are normal. Pupils are equal, round, and reactive to light.  Neck: Normal range of motion. Neck supple. No JVD present. No thyromegaly present.  Cardiovascular: Normal rate, regular rhythm, normal heart sounds and intact distal pulses.   Pulmonary/Chest: Effort normal and breath sounds normal.  Abdominal: Soft. Bowel sounds are normal. She exhibits no distension. There is no tenderness.  Musculoskeletal: Normal range of motion.  Lymphadenopathy:    She has no cervical adenopathy.  Neurological: She is alert and oriented to person, place, and time. No sensory deficit. She exhibits normal muscle tone.  Skin: Skin is warm and dry. Capillary refill takes less than 2 seconds. Rash (desquamating rash to hands and feet) noted.  Psychiatric: She has a normal mood and affect. Her behavior is normal.  Vitals reviewed.    ASSESSMENT & PLAN: April Wolfe was seen today for sore throat.  Diagnoses and all orders for this visit:  Epidermolysis bullosa simplex  Rash and nonspecific skin eruption  Other orders -     predniSONE (DELTASONE) 20 MG tablet; Take 2 tablets (40 mg total) by mouth daily with breakfast.    Patient Instructions       IF you received an x-ray today, you will receive an invoice from [facility]. Please contact [facility] at [phone] with questions or  concerns regarding your invoice.   IF you received labwork today, you will receive an invoice from [facility]. Please contact [facility] at [phone] with questions or concerns regarding your invoice.   Our billing staff will not be able to assist you with questions regarding bills from these companies.  You will be contacted with the lab results as soon as they are available. The fastest way to get your results is to activate your My Chart account. Instructions are located on the last page of this paperwork. If you have not heard from [place] regarding the results in 2 weeks, please contact this office.      Rash A rash is a change in the color of the skin. A rash can also change the way your skin feels. There are many different conditions and factors that can cause a rash. Follow these instructions at home: Pay attention to any changes in your symptoms. Follow these instructions to help with your  condition: Medicine  Take or apply over-the-counter and prescription medicines only as told by your doctor. These may include:  Corticosteroid cream.  Anti-itch lotions.  Oral antihistamines. Skin Care   Put cool compresses on the affected areas.  Try taking a bath with:  Epsom salts. Follow the instructions on the packaging. You can get these at your local pharmacy or grocery store.  Baking soda. Pour a small amount into the bath as told by your doctor.  Colloidal oatmeal. Follow the instructions on the packaging. You can get this at your local pharmacy or grocery store.  Try putting baking soda paste onto your skin. Stir water into baking soda until it gets like a paste.  Do not scratch or rub your skin.  Avoid covering the rash. Make sure the rash is exposed to air as much as possible. General instructions   Avoid hot showers or baths, which can make itching worse. A cold shower may help.  Avoid scented soaps, detergents, and perfumes. Use gentle soaps, detergents, perfumes, and  other cosmetic products.  Avoid anything that causes your rash. Keep a journal to help track what causes your rash. Write down:  What you eat.  What cosmetic products you use.  What you drink.  What you wear. This includes jewelry.  Keep all follow-up visits as told by your doctor. This is important. Contact a doctor if:  You sweat at night.  You lose weight.  You pee (urinate) more than normal.  You feel weak.  You throw up (vomit).  Your skin or the whites of your eyes look yellow (jaundice).  Your skin:  Tingles.  Is numb.  Your rash:  Does not go away after a few days.  Gets worse.  You are:  More thirsty than normal.  More tired than normal.  You have:  New symptoms.  Pain in your belly (abdomen).  A fever.  Watery poop (diarrhea). Get help right away if:  Your rash covers all or most of your body. The rash may or may not be painful.  You have blisters that:  Are on top of the rash.  Grow larger.  Grow together.  Are painful.  Are inside your nose or mouth.  You have a rash that:  Looks like purple pinprick-sized spots all over your body.  Has a "bull's eye" or looks like a target.  Is red and painful, causes your skin to peel, and is not from being in the sun too long. This information is not intended to replace advice given to you by your health care provider. Make sure you discuss any questions you have with your health care provider. Document Released: 10/30/2007 Document Revised: 10/19/2015 Document Reviewed: 09/28/2014 Elsevier Interactive Patient Education  2017 Elsevier Inc.      Agustina Caroli, MD [facility]

## 2016-12-20 ENCOUNTER — Ambulatory Visit (HOSPITAL_COMMUNITY)
Admission: EM | Admit: 2016-12-20 | Discharge: 2016-12-20 | Disposition: A | Discharge status: Home / Self Care | Payer: 59 | Attending: Physician Assistant | Admitting: Physician Assistant

## 2016-12-20 ENCOUNTER — Encounter (HOSPITAL_COMMUNITY): Payer: Self-pay | Admitting: Family Medicine

## 2016-12-20 DIAGNOSIS — N898 Other specified noninflammatory disorders of vagina: Secondary | ICD-10-CM | POA: Insufficient documentation

## 2016-12-20 DIAGNOSIS — Z90710 Acquired absence of both cervix and uterus: Secondary | ICD-10-CM | POA: Diagnosis not present

## 2016-12-20 DIAGNOSIS — F17210 Nicotine dependence, cigarettes, uncomplicated: Secondary | ICD-10-CM | POA: Diagnosis not present

## 2016-12-20 MED ORDER — METRONIDAZOLE 0.75 % VA GEL: 1.0000 | Freq: Two times a day (BID) | VAGINAL | 0 refills | Status: AC

## 2016-12-20 MED ORDER — CEFTRIAXONE SODIUM 250 MG IJ SOLR
INTRAMUSCULAR | Status: AC
  Filled 2016-12-20: qty 250

## 2016-12-20 MED ORDER — AZITHROMYCIN 250 MG PO TABS
1000.0000 mg | ORAL_TABLET | Freq: Once | ORAL | Status: AC
  Administered 2016-12-20: 1000 mg via ORAL

## 2016-12-20 MED ORDER — CEFTRIAXONE SODIUM 250 MG IJ SOLR
250.0000 mg | Freq: Once | INTRAMUSCULAR | Status: AC
  Administered 2016-12-20: 250 mg via INTRAMUSCULAR

## 2016-12-20 MED ORDER — AZITHROMYCIN 250 MG PO TABS
ORAL_TABLET | ORAL | Status: AC
  Filled 2016-12-20: qty 4

## 2016-12-20 NOTE — ED Provider Notes (Signed)
CSN: [id number]     Arrival date & time 12/20/16  1037 History   None    Chief Complaint  Patient presents with  . Vaginal Discharge  . Vaginal Itching   (Consider location/radiation/quality/duration/timing/severity/associated sxs/prior Treatment) 36 year old female with history of chlamydia, hysterectomy comes in for a few day history of vaginal discharge and odor. She denies any fever, chills, night sweats. Denies urinary symptoms such as frequency, dysuria, hematuria. Denies abdominal pain, nausea, vomiting. She is also experiencing some vaginal itching, and is worried about STDs. Sexually active with one partner, who has penile discharge.      Past Medical History:  Diagnosis Date  . Chlamydia 04/16/12  . DUB (dysfunctional uterine bleeding)   . Epidermolysis bullosa simplex   . Fibroids   . Gonorrhea 04/16/12  . No pertinent past medical history   . Obesity    Past Surgical History:  Procedure Laterality Date  . BILATERAL SALPINGECTOMY Bilateral 11/13/2012   Procedure: BILATERAL SALPINGECTOMY;  Surgeon: Emily Filbert, MD;  Location: [facility];  Service: Gynecology;  Laterality: Bilateral;  . CYSTOSCOPY N/A 11/13/2012   Procedure: CYSTOSCOPY;  Surgeon: Emily Filbert, MD;  Location: [facility];  Service: Gynecology;  Laterality: N/A;  . ROBOTIC ASSISTED TOTAL HYSTERECTOMY N/A 11/13/2012   Procedure: ROBOTIC ASSISTED TOTAL HYSTERECTOMY;  Surgeon: Emily Filbert, MD;  Location: [facility];  Service: Gynecology;  Laterality: N/A;  . TONSILLECTOMY     Family History  Problem Relation Age of Onset  . Hypertension Father   . Hypertension Mother   . Diabetes Mother   . Hypertension Brother   . Sarcoidosis Brother   . Kidney disease Brother   . Hypertension Brother   . Diabetes Brother    Social History  Substance Use Topics  . Smoking status: Current Every Day Smoker    Packs/day: 0.25  . Smokeless tobacco: Never Used  . Alcohol use 0.5 oz/week    1 Standard drinks or equivalent per week      Comment: socially   OB History    Gravida Para Term Preterm AB Living   3 2 1 1 1 2    SAB TAB Ectopic Multiple Live Births   1 0 0 0       Review of Systems  Reason unable to perform ROS: See HPI as above.    Allergies  Patient has no known allergies.  Home Medications   Prior to Admission medications   Medication Sig Start Date End Date Taking? Authorizing Provider  ibuprofen (ADVIL,MOTRIN) 600 MG tablet Take 1 tablet (600 mg total) by mouth every 6 (six) hours as needed. Patient not taking: Reported on 10/05/2016 05/10/14   Margarita Mail, PA-C  meloxicam (MOBIC) 7.5 MG tablet Take 1 tablet (7.5 mg total) by mouth daily. Patient not taking: Reported on 10/05/2016 05/25/14   Noe Gens, PA-C  methocarbamol (ROBAXIN) 500 MG tablet Take 2 tablets (1,000 mg total) by mouth 4 (four) times daily. Patient not taking: Reported on 10/05/2016 05/25/14   Noe Gens, PA-C  metroNIDAZOLE (METROGEL VAGINAL) 0.75 % vaginal gel Place 1 Applicatorful vaginally 2 (two) times daily. 12/20/16 12/25/16  Ok Edwards, PA-C  naproxen (NAPROSYN) 500 MG tablet Take 1 tablet (500 mg total) by mouth 2 (two) times daily with a meal. Patient not taking: Reported on 10/05/2016 05/10/14   Margarita Mail, PA-C  oxyCODONE-acetaminophen (PERCOCET) 5-325 MG per tablet Take 1-2 tablets by mouth every 4 (four) hours as needed. Patient not taking: Reported on  10/05/2016 05/10/14   Margarita Mail, PA-C  traMADol (ULTRAM) 50 MG tablet Take 1 tablet (50 mg total) by mouth every 6 (six) hours as needed. Patient not taking: Reported on 10/05/2016 05/25/14   Noe Gens, PA-C   Meds Ordered and Administered this Visit   Medications  cefTRIAXone (ROCEPHIN) injection 250 mg (250 mg Intramuscular Given 12/20/16 1213)  azithromycin (ZITHROMAX) tablet 1,000 mg (1,000 mg Oral Given 12/20/16 1213)    BP 136/87   Pulse 77   Temp 98.2 F (36.8 C)   Resp 18   LMP 11/04/2012   SpO2 95%  No data found.   Physical  Exam  Constitutional: She is oriented to person, place, and time. She appears well-developed and well-nourished. No distress.  HENT:  Head: Normocephalic and atraumatic.  Eyes: Pupils are equal, round, and reactive to light. Conjunctivae are normal.  Cardiovascular: Normal rate, regular rhythm and normal heart sounds.  Exam reveals no gallop and no friction rub.   No murmur heard. Pulmonary/Chest: Effort normal and breath sounds normal. She has no wheezes. She has no rales.  Abdominal: Soft. Bowel sounds are normal. She exhibits no mass. There is no tenderness. There is no rebound and no guarding.  Genitourinary: There is no rash, tenderness or lesion on the right labia. There is no rash, tenderness or lesion on the left labia. Right adnexum displays no mass and no tenderness. Left adnexum displays no mass and no tenderness. No bleeding in the vagina. Vaginal discharge found.  Genitourinary Comments: White discharge with fishy odor in vaginal canal. Patient is status post hysterectomy, without cervix.  Neurological: She is alert and oriented to person, place, and time.  Skin: Skin is warm and dry.  Psychiatric: She has a normal mood and affect. Her behavior is normal. Judgment normal.    Urgent Care Course     Procedures (including critical care time)  Labs Review Labs Reviewed  CERVICOVAGINAL ANCILLARY ONLY    Imaging Review No results found.       MDM   1. Vaginal discharge    Patient will like to be treated empirically for gonorrhea and Chlamydia. Azithromycin and Rocephin given at office. Given exam, we'll treat empirically for bacterial vaginosis. Topical Flagyl called in for treatment. Cytology sent, patient will be contacted with any positive results that requires further treatment. Monitor for worsening of symptoms, nausea, vomiting, abdominal pain, to follow up for reevaluation.   Ok Edwards, PA-C 12/20/16 1331

## 2016-12-20 NOTE — Discharge Instructions (Signed)
You have been treated empirically for gonorrhea and Chlamydia. Culture sent, you will be notified for any positive results that require further treatment. Given examination, we'll treat for bacterial vaginosis, use gel vaginally twice a day for 5 days. Refrain from sexual activity for the next 7 days. Monitor for worsening of symptoms, abdominal pain, fever, nausea, vomiting, follow-up for reevaluation.

## 2016-12-20 NOTE — ED Triage Notes (Signed)
Pt here for vaginal itching and odor. sts that her boyfriend has been having discharge.

## 2016-12-23 LAB — CERVICOVAGINAL ANCILLARY ONLY
BACTERIAL VAGINITIS: POSITIVE — AB
Candida vaginitis: NEGATIVE
Chlamydia: NEGATIVE
NEISSERIA GONORRHEA: NEGATIVE
Trichomonas: NEGATIVE

## 2016-12-30 ENCOUNTER — Telehealth (HOSPITAL_COMMUNITY): Payer: Self-pay | Admitting: Physician Assistant

## 2016-12-30 ENCOUNTER — Telehealth (HOSPITAL_COMMUNITY): Payer: Self-pay | Admitting: Nurse Practitioner

## 2016-12-30 MED ORDER — METRONIDAZOLE 500 MG PO TABS: 500.0000 mg | ORAL_TABLET | Freq: Two times a day (BID) | ORAL | 0 refills | Status: DC

## 2016-12-30 NOTE — Telephone Encounter (Signed)
Pt called [facility] requesting her prescription be changed from a cream to a pill. She was unable to fill the cream due to cost. AMY, PA aware and new prescription sent. Called pt to notify.

## 2016-12-30 NOTE — Telephone Encounter (Signed)
Patient called back, did not fill topical Flagyl due to price. Cytology returned with positive BV. Patient would like to take oral Flagyl instead. Flagyl 500 mg twice a day for 7 days called in. Recheck as needed.

## 2017-04-08 ENCOUNTER — Telehealth: Payer: 59 | Admitting: Nurse Practitioner

## 2017-04-08 DIAGNOSIS — R059 Cough, unspecified: Secondary | ICD-10-CM

## 2017-04-08 DIAGNOSIS — R05 Cough: Secondary | ICD-10-CM | POA: Diagnosis not present

## 2017-04-08 MED ORDER — BENZONATATE 100 MG PO CAPS: 100.0000 mg | ORAL_CAPSULE | Freq: Three times a day (TID) | ORAL | 0 refills | Status: DC | PRN

## 2017-04-08 NOTE — Progress Notes (Signed)

## 2017-05-02 ENCOUNTER — Telehealth: Payer: 59 | Admitting: Family

## 2017-05-02 DIAGNOSIS — J029 Acute pharyngitis, unspecified: Secondary | ICD-10-CM | POA: Diagnosis not present

## 2017-05-02 MED ORDER — PREDNISONE 5 MG PO TABS: 5.0000 mg | ORAL_TABLET | ORAL | 0 refills | Status: DC

## 2017-05-02 MED ORDER — BENZONATATE 100 MG PO CAPS: 100.0000 mg | ORAL_CAPSULE | Freq: Three times a day (TID) | ORAL | 0 refills | Status: DC | PRN

## 2017-05-02 NOTE — Progress Notes (Signed)
Thank you for the details you included in the comment boxes. Those details are very helpful in determining the best course of treatment for you and help [place] to provide the best care.  We are sorry that you are not feeling well.  Here is how we plan to help!  Based on your presentation I believe you most likely have A cough due to a virus.  This is called viral bronchitis and is best treated by rest, plenty of fluids and control of the cough.  You may use Ibuprofen or Tylenol as directed to help your symptoms.     In addition you may use A non-prescription cough medication called Mucinex DM: take 2 tablets every 12 hours. and A prescription cough medication called Tessalon Perles 100mg . You may take 1-2 capsules every 8 hours as needed for your cough.  Sterapred 5 mg dosepak  From your responses in the eVisit questionnaire you describe inflammation in the upper respiratory tract which is causing a significant cough.  This is commonly called Bronchitis and has four common causes:    Allergies  Viral Infections  Acid Reflux  Bacterial Infection Allergies, viruses and acid reflux are treated by controlling symptoms or eliminating the cause. An example might be a cough caused by taking certain blood pressure medications. You stop the cough by changing the medication. Another example might be a cough caused by acid reflux. Controlling the reflux helps control the cough.  USE OF BRONCHODILATOR ("RESCUE") INHALERS: There is a risk from using your bronchodilator too frequently.  The risk is that over-reliance on a medication which only relaxes the muscles surrounding the breathing tubes can reduce the effectiveness of medications prescribed to reduce swelling and congestion of the tubes themselves.  Although you feel brief relief from the bronchodilator inhaler, your asthma may actually be worsening with the tubes becoming more swollen and filled with mucus.  This can delay other crucial treatments, such  as oral steroid medications. If you need to use a bronchodilator inhaler daily, several times per day, you should discuss this with your provider.  There are probably better treatments that could be used to keep your asthma under control.     HOME CARE . Only take medications as instructed by your medical team. . Complete the entire course of an antibiotic. . Drink plenty of fluids and get plenty of rest. . Avoid close contacts especially the very young and the elderly . Cover your mouth if you cough or cough into your sleeve. . Always remember to wash your hands . A steam or ultrasonic humidifier can help congestion.   GET HELP RIGHT AWAY IF: . You develop worsening fever. . You become short of breath . You cough up blood. . Your symptoms persist after you have completed your treatment plan MAKE SURE YOU   Understand these instructions.  Will watch your condition.  Will get help right away if you are not doing well or get worse.  Your e-visit answers were reviewed by a board certified advanced clinical practitioner to complete your personal care plan.  Depending on the condition, your plan could have included both over the counter or prescription medications. If there is a problem please reply  once you have received a response from your provider. Your safety is important to [place].  If you have drug allergies check your prescription carefully.    You can use MyChart to ask questions about today's visit, request a non-urgent call back, or ask for a work  or school excuse for 24 hours related to this e-Visit. If it has been greater than 24 hours you will need to follow up with your provider, or enter a new e-Visit to address those concerns. You will get an e-mail in the next two days asking about your experience.  I hope that your e-visit has been valuable and will speed your recovery. Thank you for using e-visits.

## 2017-09-06 DIAGNOSIS — Z1231 Encounter for screening mammogram for malignant neoplasm of breast: Secondary | ICD-10-CM | POA: Diagnosis not present

## 2017-10-27 DIAGNOSIS — N951 Menopausal and female climacteric states: Secondary | ICD-10-CM | POA: Diagnosis not present

## 2017-10-27 DIAGNOSIS — R635 Abnormal weight gain: Secondary | ICD-10-CM | POA: Diagnosis not present

## 2017-11-04 DIAGNOSIS — Z1339 Encounter for screening examination for other mental health and behavioral disorders: Secondary | ICD-10-CM | POA: Diagnosis not present

## 2017-11-04 DIAGNOSIS — Z1331 Encounter for screening for depression: Secondary | ICD-10-CM | POA: Diagnosis not present

## 2017-11-04 DIAGNOSIS — R232 Flushing: Secondary | ICD-10-CM | POA: Diagnosis not present

## 2017-11-04 DIAGNOSIS — R7989 Other specified abnormal findings of blood chemistry: Secondary | ICD-10-CM | POA: Diagnosis not present

## 2017-11-04 DIAGNOSIS — R7303 Prediabetes: Secondary | ICD-10-CM | POA: Diagnosis not present

## 2017-11-04 DIAGNOSIS — E782 Mixed hyperlipidemia: Secondary | ICD-10-CM | POA: Diagnosis not present

## 2017-11-04 DIAGNOSIS — E559 Vitamin D deficiency, unspecified: Secondary | ICD-10-CM | POA: Diagnosis not present

## 2017-11-13 DIAGNOSIS — Z713 Dietary counseling and surveillance: Secondary | ICD-10-CM | POA: Diagnosis not present

## 2017-11-13 DIAGNOSIS — E782 Mixed hyperlipidemia: Secondary | ICD-10-CM | POA: Diagnosis not present

## 2017-11-23 ENCOUNTER — Ambulatory Visit (INDEPENDENT_AMBULATORY_CARE_PROVIDER_SITE_OTHER): Payer: Self-pay | Admitting: Family Medicine

## 2017-11-23 ENCOUNTER — Encounter: Payer: Self-pay | Admitting: Family Medicine

## 2017-11-23 VITALS — BP 106/80 | HR 73 | Temp 98.3°F | Resp 20 | Wt 259.2 lb

## 2017-11-23 DIAGNOSIS — Z0000 Encounter for general adult medical examination without abnormal findings: Secondary | ICD-10-CM

## 2017-11-23 NOTE — Patient Instructions (Signed)

## 2017-11-23 NOTE — Progress Notes (Signed)
April Wolfe is a 37 y.o. female who presents today with concerns of an employment physical exam.She is seen at [facility] and reports all conditions are well managed.  Review of Systems  Constitutional: Negative for chills, fever and malaise/fatigue.  HENT: Negative for congestion, ear discharge, ear pain, sinus pain and sore throat.   Eyes: Negative.   Respiratory: Negative for cough, sputum production and shortness of breath.   Cardiovascular: Negative.  Negative for chest pain.  Gastrointestinal: Negative for abdominal pain, diarrhea, nausea and vomiting.  Genitourinary: Negative for dysuria, frequency, hematuria and urgency.  Musculoskeletal: Negative for myalgias.  Skin: Negative.   Neurological: Negative for headaches.  Endo/Heme/Allergies: Negative.   Psychiatric/Behavioral: Negative.     O: Vitals:   11/23/17 1044  BP: 106/80  Pulse: 73  Resp: 20  Temp: 98.3 F (36.8 C)  SpO2: 97%     Physical Exam  Constitutional: She is oriented to person, place, and time. Vital signs are normal. She appears well-developed and well-nourished. She is active.  Non-toxic appearance. She does not have a sickly appearance.  HENT:  Head: Normocephalic.  Right Ear: Hearing, tympanic membrane, external ear and ear canal normal.  Left Ear: Hearing, tympanic membrane, external ear and ear canal normal.  Nose: Nose normal.  Mouth/Throat: Uvula is midline and oropharynx is clear and moist.  Neck: Normal range of motion. Neck supple.  Cardiovascular: Normal rate, regular rhythm, normal heart sounds and normal pulses.  Pulmonary/Chest: Effort normal and breath sounds normal.  Abdominal: Soft. Bowel sounds are normal.  Musculoskeletal: Normal range of motion.  Lymphadenopathy:       Head (right side): No submental and no submandibular adenopathy present.       Head (left side): No submental and no submandibular adenopathy present.    She has no cervical adenopathy.   Neurological: She is alert and oriented to person, place, and time.  Psychiatric: She has a normal mood and affect. Her speech is normal and behavior is normal. She exhibits abnormal recent memory.  PHQ-9-negative Only 1 or 3 words recalled at 3 minute mark  Vitals reviewed.  A: 1. Physical exam    P: Exam findings, diagnosis etiology and medication use and indications reviewed with patient. Follow- Up and discharge instructions provided. No emergent/urgent issues found on exam.  Patient verbalized understanding of information provided and agrees with plan of care (POC), all questions answered.  1. Physical exam WNL

## 2017-12-01 DIAGNOSIS — Z713 Dietary counseling and surveillance: Secondary | ICD-10-CM | POA: Diagnosis not present

## 2017-12-01 DIAGNOSIS — N951 Menopausal and female climacteric states: Secondary | ICD-10-CM | POA: Diagnosis not present

## 2017-12-01 DIAGNOSIS — E782 Mixed hyperlipidemia: Secondary | ICD-10-CM | POA: Diagnosis not present

## 2017-12-01 DIAGNOSIS — E559 Vitamin D deficiency, unspecified: Secondary | ICD-10-CM | POA: Diagnosis not present

## 2017-12-17 DIAGNOSIS — E559 Vitamin D deficiency, unspecified: Secondary | ICD-10-CM | POA: Diagnosis not present

## 2017-12-17 DIAGNOSIS — R7303 Prediabetes: Secondary | ICD-10-CM | POA: Diagnosis not present

## 2017-12-17 DIAGNOSIS — Z713 Dietary counseling and surveillance: Secondary | ICD-10-CM | POA: Diagnosis not present

## 2018-02-16 DIAGNOSIS — H52223 Regular astigmatism, bilateral: Secondary | ICD-10-CM | POA: Diagnosis not present

## 2018-02-16 DIAGNOSIS — H5203 Hypermetropia, bilateral: Secondary | ICD-10-CM | POA: Diagnosis not present

## 2018-05-15 ENCOUNTER — Telehealth: Payer: 59 | Admitting: Family

## 2018-05-15 ENCOUNTER — Encounter: Payer: Self-pay | Admitting: Family

## 2018-05-15 DIAGNOSIS — R6889 Other general symptoms and signs: Secondary | ICD-10-CM

## 2018-05-15 MED ORDER — BENZONATATE 100 MG PO CAPS: 100.0000 mg | ORAL_CAPSULE | Freq: Three times a day (TID) | ORAL | 0 refills | Status: DC | PRN

## 2018-05-15 MED ORDER — OSELTAMIVIR PHOSPHATE 75 MG PO CAPS: 75.0000 mg | ORAL_CAPSULE | Freq: Two times a day (BID) | ORAL | 0 refills | Status: DC

## 2018-05-15 NOTE — Progress Notes (Signed)
Thank you for the details you included in the comment boxes. Those details are very helpful in determining the best course of treatment for you and help [place] to provide the best care This does not present as flu at this point in time with the low fever and lack of other symptoms. However, given your high amount of contact with patients at [facility], there is a high chance of flu exposure. I am sending Tamiflu as below, in addition to [employer] 100mg , take 1-2 every 8 hours as needed for cough. This is likely a standard viral infection rather than influenza. Keep the Tamiflu and monitor your symptoms for high fever over 101.4, rash, major body aches and pains, and other flu-like symptoms. If so, take the Tamiflu. If not, continue supportive care.   E visit for Flu like symptoms   We are sorry that you are not feeling well.  Here is how we plan to help! Based on what you have shared with me it looks like you may have possible exposure to a virus that causes influenza.  Influenza or "the flu" is   an infection caused by a respiratory virus. The flu virus is highly contagious and persons who did not receive their yearly flu vaccination may "catch" the flu from close contact.  We have anti-viral medications to treat the viruses that cause this infection. They are not a "cure" and only shorten the course of the infection. These prescriptions are most effective when they are given within the first 2 days of "flu" symptoms. Antiviral medication are indicated if you have a high risk of complications from the flu. You should  also consider an antiviral medication if you are in close contact with someone who is at risk. These medications can help patients avoid complications from the flu  but have side effects that you should know. Possible side effects from Tamiflu or oseltamivir include nausea, vomiting, diarrhea, dizziness, headaches, eye redness, sleep problems or other respiratory symptoms. You should not take  Tamiflu if you have an allergy to oseltamivir or any to the ingredients in Tamiflu.  Based upon your symptoms and potential risk factors I have prescribed Oseltamivir (Tamiflu).  It has been sent to your designated pharmacy.  You will take one 75 mg capsule orally twice a day for the next 5 days.  ANYONE WHO HAS FLU SYMPTOMS SHOULD: . Stay home. The flu is highly contagious and going out or to work exposes others! . Be sure to drink plenty of fluids. Water is fine as well as fruit juices, sodas and electrolyte beverages. You may want to stay away from caffeine or alcohol. If you are nauseated, try taking small sips of liquids. How do you know if you are getting enough fluid? Your urine should be a pale yellow or almost colorless. . Get rest. . Taking a steamy shower or using a humidifier may help nasal congestion and ease sore throat pain. Using a saline nasal spray works much the same way. . Cough drops, hard candies and sore throat lozenges may ease your cough. . Line up a caregiver. Have someone check on you regularly.   GET HELP RIGHT AWAY IF: . You cannot keep down liquids or your medications. . You become short of breath . Your fell like you are going to pass out or loose consciousness. . Your symptoms persist after you have completed your treatment plan MAKE SURE YOU   Understand these instructions.  Will watch your condition.  Will get  help right away if you are not doing well or get worse.  Your e-visit answers were reviewed by a board certified advanced clinical practitioner to complete your personal care plan.  Depending on the condition, your plan could have included both over the counter or prescription medications.  If there is a problem please reply  once you have received a response from your provider.  Your safety is important to [place].  If you have drug allergies check your prescription carefully.    You can use MyChart to ask questions about today's visit, request a  non-urgent call back, or ask for a work or school excuse for 24 hours related to this e-Visit. If it has been greater than 24 hours you will need to follow up with your provider, or enter a new e-Visit to address those concerns.  You will get an e-mail in the next two days asking about your experience.  I hope that your e-visit has been valuable and will speed your recovery. Thank you for using e-visits.

## 2018-09-26 ENCOUNTER — Telehealth: Payer: 59 | Admitting: Physician Assistant

## 2018-09-26 DIAGNOSIS — R300 Dysuria: Secondary | ICD-10-CM | POA: Diagnosis not present

## 2018-09-26 MED ORDER — CEPHALEXIN 500 MG PO CAPS: 500.0000 mg | ORAL_CAPSULE | Freq: Two times a day (BID) | ORAL | 0 refills | Status: AC

## 2018-09-26 NOTE — Progress Notes (Signed)
I have spent 5 minutes in review of e-visit questionnaire, review and updating patient chart, medical decision making and response to patient.   [name] Cody Aristides Luckey, PA-C    

## 2018-09-26 NOTE — Progress Notes (Signed)

## 2018-09-27 ENCOUNTER — Telehealth: Payer: 59 | Admitting: Family

## 2018-09-27 DIAGNOSIS — B3731 Acute candidiasis of vulva and vagina: Secondary | ICD-10-CM

## 2018-09-27 DIAGNOSIS — B373 Candidiasis of vulva and vagina: Secondary | ICD-10-CM

## 2018-09-27 MED ORDER — FLUCONAZOLE 150 MG PO TABS: 150.0000 mg | ORAL_TABLET | ORAL | 0 refills | Status: DC | PRN

## 2018-09-27 NOTE — Progress Notes (Signed)
We are sorry that you are not feeling well. Here is how we plan to help! Based on what you shared with me it looks like you: May have a yeast vaginosis  Vaginosis is an inflammation of the vagina that can result in discharge, itching and pain. The cause is usually a change in the normal balance of vaginal bacteria or an infection. Vaginosis can also result from reduced estrogen levels after menopause.  Approximately 5 minutes was spent documenting and reviewing patient's chart.    The most common causes of vaginosis are:   Bacterial vaginosis which results from an overgrowth of one on several organisms that are normally present in your vagina.   Yeast infections which are caused by a naturally occurring fungus called candida.   Vaginal atrophy (atrophic vaginosis) which results from the thinning of the vagina from reduced estrogen levels after menopause.   Trichomoniasis which is caused by a parasite and is commonly transmitted by sexual intercourse.  Factors that increase your risk of developing vaginosis include: Marland Kitchen Medications, such as antibiotics and steroids . Uncontrolled diabetes . Use of hygiene products such as bubble bath, vaginal spray or vaginal deodorant . Douching . Wearing damp or tight-fitting clothing . Using an intrauterine device (IUD) for birth control . Hormonal changes, such as those associated with pregnancy, birth control pills or menopause . Sexual activity . Having a sexually transmitted infection  Your treatment plan is A single Diflucan (fluconazole) 150mg  tablet once.  I have electronically sent this prescription into the pharmacy that you have chosen.  Be sure to take all of the medication as directed. Stop taking any medication if you develop a rash, tongue swelling or shortness of breath. Mothers who are breast feeding should consider pumping and discarding their breast milk while on these antibiotics. However, there is no consensus that infant exposure  at these doses would be harmful.  Remember that medication creams can weaken latex condoms. Marland Kitchen   HOME CARE:  Good hygiene may prevent some types of vaginosis from recurring and may relieve some symptoms:  . Avoid baths, hot tubs and whirlpool spas. Rinse soap from your outer genital area after a shower, and dry the area well to prevent irritation. Don't use scented or harsh soaps, such as those with deodorant or antibacterial action. Marland Kitchen Avoid irritants. These include scented tampons and pads. . Wipe from front to back after using the toilet. Doing so avoids spreading fecal bacteria to your vagina.  Other things that may help prevent vaginosis include:  Marland Kitchen Don't douche. Your vagina doesn't require cleansing other than normal bathing. Repetitive douching disrupts the normal organisms that reside in the vagina and can actually increase your risk of vaginal infection. Douching won't clear up a vaginal infection. . Use a latex condom. Both female and female latex condoms may help you avoid infections spread by sexual contact. . Wear cotton underwear. Also wear pantyhose with a cotton crotch. If you feel comfortable without it, skip wearing underwear to bed. Yeast thrives in [employer] Your symptoms should improve in the next day or two.  GET HELP RIGHT AWAY IF:  . You have pain in your lower abdomen ( pelvic area or over your ovaries) . You develop nausea or vomiting . You develop a fever . Your discharge changes or worsens . You have persistent pain with intercourse . You develop shortness of breath, a rapid pulse, or you faint.  These symptoms could be signs of problems or infections that need to  be evaluated by a medical provider now.  MAKE SURE YOU    Understand these instructions.  Will watch your condition.  Will get help right away if you are not doing well or get worse.  Your e-visit answers were reviewed by a board certified advanced clinical practitioner to complete  your personal care plan. Depending upon the condition, your plan could have included both over the counter or prescription medications. Please review your pharmacy choice to make sure that you have choses a pharmacy that is open for you to pick up any needed prescription, Your safety is important to us. If you have drug allergies check your prescription carefully.   You can use MyChart to ask questions about today's visit, request a non-urgent call back, or ask for a work or school excuse for 24 hours related to this e-Visit. If it has been greater than 24 hours you will need to follow up with your provider, or enter a new e-Visit to address those concerns. You will get a MyChart message within the next two days asking about your experience. I hope that your e-visit has been valuable and will speed your recovery.  

## 2018-10-15 ENCOUNTER — Telehealth: Payer: 59 | Admitting: Nurse Practitioner

## 2018-10-15 DIAGNOSIS — N3000 Acute cystitis without hematuria: Secondary | ICD-10-CM

## 2018-10-16 ENCOUNTER — Other Ambulatory Visit: Payer: Self-pay

## 2018-10-16 DIAGNOSIS — B373 Candidiasis of vulva and vagina: Secondary | ICD-10-CM

## 2018-10-16 DIAGNOSIS — B3731 Acute candidiasis of vulva and vagina: Secondary | ICD-10-CM

## 2018-10-16 MED ORDER — CEPHALEXIN 500 MG PO CAPS: 500.0000 mg | ORAL_CAPSULE | Freq: Two times a day (BID) | ORAL | 0 refills | Status: DC

## 2018-10-16 NOTE — Progress Notes (Signed)
We are sorry that you are not feeling well.  Here is how we plan to help!  Based on what you shared with me it looks like you most likely have a simple urinary tract infection.  A UTI (Urinary Tract Infection) is a bacterial infection of the bladder.  Most cases of urinary tract infections are simple to treat but a key part of your care is to encourage you to drink plenty of fluids and watch your symptoms carefully.  I have prescribed Keflex 500 mg twice a day for 7 days.  Your symptoms should gradually improve. Call us if the burning in your urine worsens, you develop worsening fever, back pain or pelvic pain or if your symptoms do not resolve after completing the antibiotic. I have also sent in diflucan as requested  Urinary tract infections can be prevented by drinking plenty of water to keep your body hydrated.  Also be sure when you wipe, wipe from front to back and don't hold it in!  If possible, empty your bladder every 4 hours.  Your e-visit answers were reviewed by a board certified advanced clinical practitioner to complete your personal care plan.  Depending on the condition, your plan could have included both over the counter or prescription medications.  If there is a problem please reply  once you have received a response from your provider.  Your safety is important to [place].  If you have drug allergies check your prescription carefully.    You can use MyChart to ask questions about today's visit, request a non-urgent call back, or ask for a work or school excuse for 24 hours related to this e-Visit. If it has been greater than 24 hours you will need to follow up with your provider, or enter a new e-Visit to address those concerns.   You will get an e-mail in the next two days asking about your experience.  I hope that your e-visit has been valuable and will speed your recovery. Thank you for using e-visits.  5-10 minutes spent reviewing and documenting in chart.

## 2018-10-28 ENCOUNTER — Telehealth: Payer: 59 | Admitting: Nurse Practitioner

## 2018-10-28 DIAGNOSIS — B001 Herpesviral vesicular dermatitis: Secondary | ICD-10-CM

## 2018-10-28 MED ORDER — VALACYCLOVIR HCL 1 G PO TABS: ORAL_TABLET | ORAL | 0 refills | Status: DC

## 2018-10-28 NOTE — Progress Notes (Signed)
We are sorry that you are not feeling well.  Here is how we plan to help!  Based on what you have shared with me it does look like you have a viral infection.    Most cold sores or fever blisters are small fluid filled blisters around the mouth caused by herpes simplex virus.  The most common strain of the virus causing cold sores is herpes simplex virus 1.  It can be spread by skin contact, sharing eating utensils, or even sharing towels.  Cold sores are contagious to other people until dry. (Approximately 5-7 days).  Wash your hands. You can spread the virus to your eyes through handling your contact lenses after touching the lesions.  Most people experience pain at the sight or tingling sensations in their lips that may begin before the ulcers erupt.  Herpes simplex is treatable but not curable.  It may lie dormant for a long time and then reappear due to stress or prolonged sun exposure.  Many patients have success in treating their cold sores with an over the counter topical called Abreva.  You may apply the cream up to 5 times daily (maximum 10 days) until healing occurs.  If you would like to use an oral antiviral medication to speed the healing of your cold sore, I have sent a prescription to your local pharmacy Valacyclovir 2 gm twice daily for 1 day    HOME CARE:   Wash your hands frequently.  Do not pick at or rub the sore.  Don't open the blisters.  Avoid kissing other people during this time.  Avoid sharing drinking glasses, eating utensils, or razors.  Do not handle contact lenses unless you have thoroughly washed your hands with soap and warm water!  Avoid oral sex during this time.  Herpes from sores on your mouth can spread to your partner's genital area.  Avoid contact with anyone who has eczema or a weakened immune system.  Cold sores are often triggered by exposure to intense sunlight, use a lip balm containing a sunscreen (SPF 30 or higher).  GET HELP RIGHT AWAY  IF:   Blisters look infected.  Blisters occur near or in the eye.  Symptoms last longer than 10 days.  Your symptoms become worse.  MAKE SURE YOU:   Understand these instructions.  Will watch your condition.  Will get help right away if you are not doing well or get worse.    Your e-visit answers were reviewed by a board certified advanced clinical practitioner to complete your personal care plan.  Depending upon the condition, your plan could have  Included both over the counter or prescription medications.    Please review your pharmacy choice.  Be sure that the pharmacy you have chosen is open so that you can pick up your prescription now.  If there is a problem you can message your provider in [place] to have the prescription routed to another pharmacy.    Your safety is important to [place].  If you have drug allergies check our prescription carefully.  For the next 24 hours you can use MyChart to ask questions about today's visit, request a non-urgent call back, or ask for a work or school excuse from your e-visit provider.  You will get an email in the next two days asking about your experience.  I hope that your e-visit has been valuable and will speed your recovery.  5-10 minutes spent reviewing and documenting in chart.

## 2018-11-26 ENCOUNTER — Telehealth: Payer: 59 | Admitting: Family

## 2018-11-26 DIAGNOSIS — L259 Unspecified contact dermatitis, unspecified cause: Secondary | ICD-10-CM

## 2018-11-26 MED ORDER — PREDNISONE 20 MG PO TABS: 20.0000 mg | ORAL_TABLET | Freq: Every day | ORAL | 0 refills | Status: DC

## 2018-11-26 NOTE — Progress Notes (Signed)
E Visit for Rash  We are sorry that you are not feeling well. Here is how we plan to help!  Based on what you shared with me it looks like you have contact dermatitis.  Contact dermatitis is a skin rash caused by something that touches the skin and causes irritation or inflammation.  Your skin may be red, swollen, dry, cracked, and itch.  The rash should go away in a few days but can last a few weeks.  If you get a rash, it's important to figure out what caused it so the irritant can be avoided in the future. and I have prescribed Prednisone 10 mg daily for 5 days  Although an antibiotic may have helped, I feel pretty sure that Prednisone will fix this issue.  HOME CARE:   Take cool showers and avoid direct sunlight.  Apply cool compress or wet dressings.  Take a bath in an oatmeal bath.  Sprinkle content of one Aveeno packet under running faucet with comfortably warm water.  Bathe for 15-20 minutes, 1-2 times daily.  Pat dry with a towel. Do not rub the rash.  Use hydrocortisone cream.  Take an antihistamine like Benadryl for widespread rashes that itch.  The adult dose of Benadryl is 25-50 mg by mouth 4 times daily.  Caution:  This type of medication may cause sleepiness.  Do not drink alcohol, drive, or operate dangerous machinery while taking antihistamines.  Do not take these medications if you have prostate enlargement.  Read package instructions thoroughly on all medications that you take.  GET HELP RIGHT AWAY IF:   Symptoms don't go away after treatment.  Severe itching that persists.  If you rash spreads or swells.  If you rash begins to smell.  If it blisters and opens or develops a yellow-brown crust.  You develop a fever.  You have a sore throat.  You become short of breath.  MAKE SURE YOU:  Understand these instructions. Will watch your condition. Will get help right away if you are not doing well or get worse.  Thank you for choosing an e-visit. Your  e-visit answers were reviewed by a board certified advanced clinical practitioner to complete your personal care plan. Depending upon the condition, your plan could have included both over the counter or prescription medications. Please review your pharmacy choice. Be sure that the pharmacy you have chosen is open so that you can pick up your prescription now.  If there is a problem you may message your provider in [place] to have the prescription routed to another pharmacy. Your safety is important to [place]. If you have drug allergies check your prescription carefully.  For the next 24 hours, you can use MyChart to ask questions about today's visit, request a non-urgent call back, or ask for a work or school excuse from your e-visit provider. You will get an email in the next two days asking about your experience. I hope that your e-visit has been valuable and will speed your recovery.   Greater than 5 minutes, yet less than 10 minutes of time have been spent researching, coordinating, and implementing care for this patient today.  Thank you for the details you included in the comment boxes. Those details are very helpful in determining the best course of treatment for you and help [place] to provide the best care.

## 2018-11-30 ENCOUNTER — Ambulatory Visit (INDEPENDENT_AMBULATORY_CARE_PROVIDER_SITE_OTHER)
Admission: RE | Admit: 2018-11-30 | Discharge: 2018-11-30 | Disposition: A | Discharge status: Home / Self Care | Payer: 59 | Source: Ambulatory Visit

## 2018-11-30 DIAGNOSIS — N644 Mastodynia: Secondary | ICD-10-CM | POA: Diagnosis not present

## 2018-11-30 MED ORDER — SULFAMETHOXAZOLE-TRIMETHOPRIM 800-160 MG PO TABS: 1.0000 | ORAL_TABLET | Freq: Two times a day (BID) | ORAL | 0 refills | Status: AC

## 2018-11-30 NOTE — Discharge Instructions (Signed)
Take the Bactrim as prescribed Warm compresses  If symptoms worsen or do not improve you will need to be seen in person.  Follow up with OB/GYN or dermatology as needed.

## 2018-11-30 NOTE — ED Provider Notes (Addendum)
Virtual Visit via Video Note:  April Wolfe  initiated request for Telemedicine visit with [facility]. I connected with April Wolfe  on 11/30/2018 at 3:55 PM  for a synchronized telemedicine visit using a video enabled HIPPA compliant telemedicine application. I verified that I am speaking with April Wolfe  using two identifiers. April July, April Wolfe  was physically located in a [facility] site and April Wolfe was located at a different location.   The limitations of evaluation and management by telemedicine as well as the availability of in-person appointments were discussed. Patient was informed that she  may incur a bill ( including co-pay) for this virtual visit encounter. April Wolfe  expressed understanding and gave verbal consent to proceed with virtual visit.     History of Present Illness:April Wolfe  is a 38 y.o. female presents with painful, raised area to areola.  Reporting that this started yesterday and is worsened over the last 24 hours.  Denies any fevers.  Reporting clear discharge from the area.  No nipple discharge or lumps in breast.  Recent mammogram approximately 1 year ago that was normal.  Also notes that she has had a flat mole to the same area for many many years.  No history of breast cancer or skin cancer.  Past Medical History:  Diagnosis Date  . Chlamydia 04/16/12  . DUB (dysfunctional uterine bleeding)   . Epidermolysis bullosa simplex   . Fibroids   . Gonorrhea 04/16/12  . No pertinent past medical history   . Obesity     No Known Allergies      Observations/Objective:GENERAL APPEARANCE: Well developed, well nourished, alert and cooperative, and appears to be in no acute distress. HEAD: normocephalic. Non labored breathing, no dyspnea or distress Skin: Skin normal color  PSYCHIATRIC: The mental examination revealed the patient was oriented to person, place, and time. The patient was able to demonstrate good  judgement and reason, without hallucinations, abnormal affect or abnormal behaviors during the examination. Patient is not suicidal     Assessment and Plan: Treating for possible areolar abscess. There is some concern with change in mole Treating with antibiotics and recommended if symptoms do not improve or worsen she will need to follow-up with OB/GYN for ultrasound or dermatology for biopsy. If she is unable to follow-up she will need to come in person for better evaluation of the problem.   Follow Up Instructions: Follow up as needed for continued or worsening symptoms     I discussed the assessment and treatment plan with the patient. The patient was provided an opportunity to ask questions and all were answered. The patient agreed with the plan and demonstrated an understanding of the instructions.   The patient was advised to call back or seek an in-person evaluation if the symptoms worsen or if the condition fails to improve as anticipated.      April July, April Wolfe  11/30/2018 3:55 PM         April July, April Wolfe 11/30/18 [id number]

## 2019-01-29 ENCOUNTER — Encounter (HOSPITAL_COMMUNITY): Payer: Self-pay | Admitting: *Deleted

## 2019-01-29 ENCOUNTER — Emergency Department (HOSPITAL_COMMUNITY)
Admission: EM | Admit: 2019-01-29 | Discharge: 2019-01-29 | Disposition: A | Discharge status: Home / Self Care | Payer: 59 | Attending: Emergency Medicine | Admitting: Emergency Medicine

## 2019-01-29 ENCOUNTER — Other Ambulatory Visit: Payer: Self-pay

## 2019-01-29 ENCOUNTER — Emergency Department (HOSPITAL_COMMUNITY): Payer: 59

## 2019-01-29 DIAGNOSIS — Y999 Unspecified external cause status: Secondary | ICD-10-CM | POA: Insufficient documentation

## 2019-01-29 DIAGNOSIS — F17210 Nicotine dependence, cigarettes, uncomplicated: Secondary | ICD-10-CM | POA: Diagnosis not present

## 2019-01-29 DIAGNOSIS — R0789 Other chest pain: Secondary | ICD-10-CM | POA: Insufficient documentation

## 2019-01-29 DIAGNOSIS — Z79899 Other long term (current) drug therapy: Secondary | ICD-10-CM | POA: Insufficient documentation

## 2019-01-29 DIAGNOSIS — T148XXA Other injury of unspecified body region, initial encounter: Secondary | ICD-10-CM

## 2019-01-29 DIAGNOSIS — Y929 Unspecified place or not applicable: Secondary | ICD-10-CM | POA: Diagnosis not present

## 2019-01-29 DIAGNOSIS — S29012A Strain of muscle and tendon of back wall of thorax, initial encounter: Secondary | ICD-10-CM | POA: Insufficient documentation

## 2019-01-29 DIAGNOSIS — X58XXXA Exposure to other specified factors, initial encounter: Secondary | ICD-10-CM | POA: Diagnosis not present

## 2019-01-29 DIAGNOSIS — R079 Chest pain, unspecified: Secondary | ICD-10-CM | POA: Diagnosis not present

## 2019-01-29 DIAGNOSIS — Y939 Activity, unspecified: Secondary | ICD-10-CM | POA: Insufficient documentation

## 2019-01-29 DIAGNOSIS — S299XXA Unspecified injury of thorax, initial encounter: Secondary | ICD-10-CM | POA: Diagnosis present

## 2019-01-29 LAB — I-STAT BETA HCG BLOOD, ED (MC, WL, AP ONLY): I-stat hCG, quantitative: <5 m[IU]/mL (ref <5)

## 2019-01-29 LAB — CBC
HCT: 41.7 % (ref 36.0–46.0)
Hemoglobin: 13.2 g/dL (ref 12.0–15.0)
MCH: 28.9 pg (ref 26.0–34.0)
MCHC: 31.7 g/dL (ref 30.0–36.0)
MCV: 91.2 fL (ref 80.0–100.0)
Platelets: 553 10*3/uL — ABNORMAL HIGH (ref 150–400)
RBC: 4.57 MIL/uL (ref 3.87–5.11)
RDW: 13.1 % (ref 11.5–15.5)
WBC: 9.5 10*3/uL (ref 4.0–10.5)
nRBC: 0 % (ref 0.0–0.2)

## 2019-01-29 LAB — BASIC METABOLIC PANEL
Anion gap: 8 (ref 5–15)
BUN: 7 mg/dL (ref 6–20)
CO2: 23 mmol/L (ref 22–32)
Calcium: 9.5 mg/dL (ref 8.9–10.3)
Chloride: 107 mmol/L (ref 98–111)
Creatinine, Ser: 0.72 mg/dL (ref 0.44–1.00)
GFR calc Af Amer: >60 mL/min (ref >60)
GFR calc non Af Amer: >60 mL/min (ref >60)
Glucose, Bld: 139 mg/dL — ABNORMAL HIGH (ref 70–99)
Potassium: 4.1 mmol/L (ref 3.5–5.1)
Sodium: 138 mmol/L (ref 135–145)

## 2019-01-29 LAB — TROPONIN I (HIGH SENSITIVITY): Troponin I (High Sensitivity): <2 ng/L (ref <18)

## 2019-01-29 MED ORDER — SODIUM CHLORIDE 0.9% FLUSH: 3.0000 mL | Freq: Once | INTRAVENOUS | Status: DC

## 2019-01-29 MED ORDER — CYCLOBENZAPRINE HCL 10 MG PO TABS: 10.0000 mg | ORAL_TABLET | Freq: Two times a day (BID) | ORAL | 0 refills | Status: DC | PRN

## 2019-01-29 NOTE — ED Notes (Signed)
Pt expressed understanding for ED visit and treatment. Discharge instructions reviewed, medications reviewed, opportunity for questions and answers provided.

## 2019-01-29 NOTE — ED Provider Notes (Signed)
[facility] EMERGENCY DEPARTMENT Provider Note   CSN: [id number] Arrival date & time: 01/29/19  1229     History   Chief Complaint Chief Complaint  Patient presents with  . Chest Pain    HPI April Wolfe is a 38 y.o. female.     The history is provided by the patient and medical records. No language interpreter was used.  Chest Pain  April Wolfe is a 38 y.o. female who presents to the Emergency Department complaining of chest pain and back pain. She presents to the emergency department complaining of right sided thoracic/scapular back pain that began about 830 this morning. The pain is described as sharp in nature and radiates around to the front end and feels like a pressure type sensation in the front of her chest. Pain also goes down her arm and is worse with movement of her right arm. Pain is worse when she takes a deep breath but she denies any shortness of breath. She denies any fevers, diaphoresis, nausea, vomiting, abdominal pain, leg swelling or pain. She is right-hand dominant. No known injuries. She has no medical problems and takes no medications. She does smoke cigarettes. She uses occasional alcohol, no street drugs. No family history of DVT or coronary artery disease. She took aspirin prior to ED arrival and states that her symptoms are mildly improved. Past Medical History:  Diagnosis Date  . Chlamydia 04/16/12  . DUB (dysfunctional uterine bleeding)   . Epidermolysis bullosa simplex   . Fibroids   . Gonorrhea 04/16/12  . No pertinent past medical history   . Obesity     Patient Active Problem List   Diagnosis Date Noted  . 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 Rash and nonspecific skin eruption 10/05/2016  . Anemia 11/13/2012  . Leiomyoma of uterus, unspecified 10/03/2011    Past Surgical History:  Procedure Laterality Date  . BILATERAL SALPINGECTOMY Bilateral 11/13/2012   Procedure: BILATERAL SALPINGECTOMY;  Surgeon: Emily Filbert, MD;  Location: [facility];  Service: Gynecology;  Laterality: Bilateral;  . CYSTOSCOPY N/A 11/13/2012   Procedure: CYSTOSCOPY;  Surgeon: Emily Filbert, MD;  Location: [facility];  Service: Gynecology;  Laterality: N/A;  . ROBOTIC ASSISTED TOTAL HYSTERECTOMY N/A 11/13/2012   Procedure: ROBOTIC ASSISTED TOTAL HYSTERECTOMY;  Surgeon: Emily Filbert, MD;  Location: [facility];  Service: Gynecology;  Laterality: N/A;  . TONSILLECTOMY       OB History    Gravida  3   Para  2   Term  1   Preterm  1   AB  1   Living  2     SAB  1   TAB  0   Ectopic  0   Multiple  0   Live Births               Home Medications    Prior to Admission medications   Medication Sig Start Date End Date Taking? Authorizing Provider  cyclobenzaprine (FLEXERIL) 10 MG tablet Take 1 tablet (10 mg total) by mouth 2 (two) times daily as needed for muscle spasms. 01/29/19   Quintella Reichert, MD  predniSONE (DELTASONE) 20 MG tablet Take 1 tablet (20 mg total) by mouth daily with breakfast. 11/26/18   Dutch Quint B, FNP  valACYclovir (VALTREX) 1000 MG tablet 2 po bid for 1 day 10/28/18   Chevis Pretty, FNP    Family History Family History  Problem Relation Age of Onset  . Hypertension Father   .  Hypertension Mother   . Diabetes Mother   . Hypertension Brother   . Sarcoidosis Brother   . Kidney disease Brother   . Hypertension Brother   . Diabetes Brother     Social History Social History   Tobacco Use  . Smoking status: Current Every Day Smoker    Packs/day: 0.25  . Smokeless tobacco: Never Used  Substance Use Topics  . Alcohol use: Yes    Alcohol/week: 1.0 standard drinks    Types: 1 Standard drinks or equivalent per week    Comment: socially  . Drug use: No     Allergies   Patient has no known allergies.   Review of Systems Review of Systems  Cardiovascular: Positive for chest pain.  All other systems reviewed and are negative.    Physical Exam Updated Vital Signs BP 140/81    Pulse 68   Temp 98.4 F (36.9 C) (Oral)   Resp (!) 24   Ht 5\' 4"  (1.626 m)   Wt 117.9 kg   LMP 11/04/2012   SpO2 98%   BMI 44.63 kg/m   Physical Exam Vitals signs and nursing note reviewed.  Constitutional:      Appearance: She is well-developed.  HENT:     Head: Normocephalic and atraumatic.  Cardiovascular:     Rate and Rhythm: Normal rate and regular rhythm.     Heart sounds: No murmur.  Pulmonary:     Effort: Pulmonary effort is normal. No respiratory distress.     Breath sounds: Normal breath sounds.  Abdominal:     Palpations: Abdomen is soft.     Tenderness: There is no abdominal tenderness. There is no guarding or rebound.  Musculoskeletal:        General: Tenderness present. No swelling.     Comments: 2+ radial pulses bilaterally. There is tenderness to palpation over the right scapular region. Range of motion is intact but pain is reproducible with range of motion of the right arm.  Skin:    General: Skin is warm and dry.  Neurological:     Mental Status: She is alert and oriented to person, place, and time.     Comments: Five out of five grip strength bilaterally  Psychiatric:        Mood and Affect: Mood normal.        Behavior: Behavior normal.      ED Treatments / Results  Labs (all labs ordered are listed, but only abnormal results are displayed) Labs Reviewed  BASIC METABOLIC PANEL - Abnormal; Notable for the following components:      Result Value   Glucose, Bld 139 (*)    All other components within normal limits  CBC - Abnormal; Notable for the following components:   Platelets 553 (*)    All other components within normal limits  I-STAT BETA HCG BLOOD, ED ([facility], [facility], [facility] ONLY)  TROPONIN I (HIGH SENSITIVITY)  TROPONIN I (HIGH SENSITIVITY)    EKG EKG Interpretation  Date/Time:  Friday January 29 2019 12:35:40 EDT Ventricular Rate:  82 PR Interval:  184 QRS Duration: 78 QT Interval:  356 QTC Calculation: 415 R Axis:   8 Text  Interpretation:  Normal sinus rhythm Septal infarct , age undetermined Abnormal ECG Confirmed by Quintella Reichert [phone]) on 01/29/2019 2:00:10 PM   Radiology Dg Chest 2 View  Result Date: 01/29/2019 CLINICAL DATA:  Right anterior chest pain radiating to the back with chest heaviness and shortness of breath since this morning. Smoker. EXAM:  CHEST - 2 VIEW COMPARISON:  None. FINDINGS: Normal sized heart. Clear lungs with normal vascularity. Mild diffuse peribronchial thickening. Minimal scoliosis. IMPRESSION: Mild bronchitic changes. Electronically Signed   By: Claudie Revering M.D.   On: 01/29/2019 13:28    Procedures Procedures (including critical care time)  Medications Ordered in ED Medications  sodium chloride flush (NS) 0.9 % injection 3 mL (has no administration in time range)     Initial Impression / Assessment and Plan / ED Course  I have reviewed the triage vital signs and the nursing notes.  Pertinent labs & imaging results that were available during my care of the patient were reviewed by me and considered in my medical decision making (see chart for details).        Patient here for evaluation of right sided thoracic back pain that began this morning. Pain is reproducible on examination. She is well appearing on evaluation. Presentation is not consistent with aortic dissection, ACS, PE, cholecystitis. She is PERC negative. Discussed with patient home care for thoracic muscle strain. Discussed outpatient follow-up and return precautions.  Final Clinical Impressions(s) / ED Diagnoses   Final diagnoses:  Atypical chest pain  Muscle strain    ED Discharge Orders         Ordered    cyclobenzaprine (FLEXERIL) 10 MG tablet  2 times daily PRN     01/29/19 1421           Quintella Reichert, MD 01/29/19 1432

## 2019-01-29 NOTE — ED Triage Notes (Addendum)
Pt states acute onset chest pressure and R sided shoulder blade pain that radiates to R arm.  Feels unable to take deep breaths and her R arm hurts so bad she can barely move it.  Pt took 324 asa and then 2 bayer

## 2019-02-24 ENCOUNTER — Other Ambulatory Visit: Payer: Self-pay

## 2019-02-25 ENCOUNTER — Telehealth: Payer: 59 | Admitting: Physician Assistant

## 2019-02-25 DIAGNOSIS — B001 Herpesviral vesicular dermatitis: Secondary | ICD-10-CM

## 2019-02-25 MED ORDER — VALACYCLOVIR HCL 1 G PO TABS: ORAL_TABLET | ORAL | 0 refills | Status: DC

## 2019-02-25 NOTE — Progress Notes (Signed)
I have spent 5 minutes in review of e-visit questionnaire, review and updating patient chart, medical decision making and response to patient.   Brinae Woods Cody Adrain Nesbit, PA-C    

## 2019-02-25 NOTE — Progress Notes (Signed)
We are sorry that you are not feeling well.  Here is how we plan to help!  Based on what you have shared with me it does look like you have a viral infection.    Most cold sores or fever blisters are small fluid filled blisters around the mouth caused by herpes simplex virus.  The most common strain of the virus causing cold sores is herpes simplex virus 1.  It can be spread by skin contact, sharing eating utensils, or even sharing towels.  Cold sores are contagious to other people until dry. (Approximately 5-7 days).  Wash your hands. You can spread the virus to your eyes through handling your contact lenses after touching the lesions.  Most people experience pain at the sight or tingling sensations in their lips that may begin before the ulcers erupt.  Herpes simplex is treatable but not curable.  It may lie dormant for a long time and then reappear due to stress or prolonged sun exposure.  Many patients have success in treating their cold sores with an over the counter topical called Abreva.  You may apply the cream up to 5 times daily (maximum 10 days) until healing occurs.  If you would like to use an oral antiviral medication to speed the healing of your cold sore, I have sent a prescription to your local pharmacy Valacyclovir 2 gm twice daily for 1 day    HOME CARE:   Wash your hands frequently.  Do not pick at or rub the sore.  Don't open the blisters.  Avoid kissing other people during this time.  Avoid sharing drinking glasses, eating utensils, or razors.  Do not handle contact lenses unless you have thoroughly washed your hands with soap and warm water!  Avoid oral sex during this time.  Herpes from sores on your mouth can spread to your partner's genital area.  Avoid contact with anyone who has eczema or a weakened immune system.  Cold sores are often triggered by exposure to intense sunlight, use a lip balm containing a sunscreen (SPF 30 or higher).  GET HELP RIGHT AWAY  IF:   Blisters look infected.  Blisters occur near or in the eye.  Symptoms last longer than 10 days.  Your symptoms become worse.  MAKE SURE YOU:   Understand these instructions.  Will watch your condition.  Will get help right away if you are not doing well or get worse.    Your e-visit answers were reviewed by a board certified advanced clinical practitioner to complete your personal care plan.  Depending upon the condition, your plan could have  Included both over the counter or prescription medications.    Please review your pharmacy choice.  Be sure that the pharmacy you have chosen is open so that you can pick up your prescription now.  If there is a problem you can message your provider in MyChart to have the prescription routed to another pharmacy.    Your safety is important to us.  If you have drug allergies check our prescription carefully.  For the next 24 hours you can use MyChart to ask questions about today's visit, request a non-urgent call back, or ask for a work or school excuse from your e-visit provider.  You will get an email in the next two days asking about your experience.  I hope that your e-visit has been valuable and will speed your recovery.  

## 2019-05-30 ENCOUNTER — Ambulatory Visit (HOSPITAL_COMMUNITY)
Admission: EM | Admit: 2019-05-30 | Discharge: 2019-05-30 | Disposition: A | Discharge status: Home / Self Care | Payer: Self-pay

## 2019-05-30 ENCOUNTER — Other Ambulatory Visit: Payer: Self-pay

## 2019-05-30 NOTE — ED Notes (Signed)
Patient left per Jenny Reichmann, patient access

## 2019-06-01 ENCOUNTER — Ambulatory Visit: Payer: Self-pay | Attending: Internal Medicine

## 2019-06-02 ENCOUNTER — Ambulatory Visit: Payer: 59 | Admitting: Nurse Practitioner

## 2019-07-06 ENCOUNTER — Ambulatory Visit: Payer: No Typology Code available for payment source | Attending: Internal Medicine

## 2019-07-06 DIAGNOSIS — Z20822 Contact with and (suspected) exposure to covid-19: Secondary | ICD-10-CM | POA: Insufficient documentation

## 2019-07-07 LAB — NOVEL CORONAVIRUS, NAA: SARS-CoV-2, NAA: NOT DETECTED

## 2020-01-15 ENCOUNTER — Ambulatory Visit (HOSPITAL_COMMUNITY)
Admission: EM | Admit: 2020-01-15 | Discharge: 2020-01-15 | Disposition: A | Discharge status: Home / Self Care | Payer: No Typology Code available for payment source | Attending: Emergency Medicine | Admitting: Emergency Medicine

## 2020-01-15 ENCOUNTER — Other Ambulatory Visit: Payer: Self-pay

## 2020-01-15 ENCOUNTER — Encounter (HOSPITAL_COMMUNITY): Payer: Self-pay

## 2020-01-15 DIAGNOSIS — N760 Acute vaginitis: Secondary | ICD-10-CM | POA: Insufficient documentation

## 2020-01-15 DIAGNOSIS — N898 Other specified noninflammatory disorders of vagina: Secondary | ICD-10-CM

## 2020-01-15 LAB — POCT URINALYSIS DIPSTICK, ED / UC
Bilirubin Urine: NEGATIVE
Glucose, UA: NEGATIVE mg/dL
Ketones, ur: NEGATIVE mg/dL
Leukocytes,Ua: NEGATIVE
Nitrite: NEGATIVE
Protein, ur: NEGATIVE mg/dL
Specific Gravity, Urine: >=1.03 (ref 1.005–1.030)
Urobilinogen, UA: 0.2 mg/dL (ref 0.0–1.0)
pH: 5 (ref 5.0–8.0)

## 2020-01-15 MED ORDER — METRONIDAZOLE 500 MG PO TABS: 500.0000 mg | ORAL_TABLET | Freq: Two times a day (BID) | ORAL | 0 refills | Status: DC

## 2020-01-15 MED ORDER — FLUCONAZOLE 150 MG PO TABS: 150.0000 mg | ORAL_TABLET | Freq: Once | ORAL | 0 refills | Status: AC

## 2020-01-15 NOTE — ED Triage Notes (Signed)
Pt present vaginal discharge, symptoms started 2-3 days ago.

## 2020-01-15 NOTE — Discharge Instructions (Addendum)
Begin metronidazole twice daily for 1 week to treat for BV.  1 tab of Diflucan today, may repeat after completion of metronidazole/Flagyl.  We are testing you for Gonorrhea, Chlamydia, Trichomonas, Yeast and Bacterial Vaginosis. We will call you if anything is positive and let you know if you require any further treatment. Please inform partners of any positive results.   Please return if symptoms not improving with treatment, development of fever, nausea, vomiting, abdominal pain.

## 2020-01-16 NOTE — ED Provider Notes (Signed)
[name]    CSN: [id number] Arrival date & time: 01/15/20  1101      History   Chief Complaint Chief Complaint  Patient presents with  . Vaginal Discharge    HPI April Wolfe is a 39 y.o. female presenting today for evaluation of vaginal irritation.  Patient reports over the past few days she has felt increased irritation to her vaginal area.  She denies any associated odor.  Has not noticed any notable discharge.  Is unsure if her symptoms are more itchy or burning in nature.  She denies any abdominal pain or pelvic pain.  Has had prior hysterectomy.  HPI  Past Medical History:  Diagnosis Date  . Chlamydia 04/16/12  . DUB (dysfunctional uterine bleeding)   . Epidermolysis bullosa simplex   . Fibroids   . Gonorrhea 04/16/12  . No pertinent past medical history   . Obesity     Patient Active Problem List   Diagnosis Date Noted  . 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 Rash and nonspecific skin eruption 10/05/2016  . Anemia 11/13/2012  . Leiomyoma of uterus, unspecified 10/03/2011    Past Surgical History:  Procedure Laterality Date  . BILATERAL SALPINGECTOMY Bilateral 11/13/2012   Procedure: BILATERAL SALPINGECTOMY;  Surgeon: Emily Filbert, MD;  Location: [facility];  Service: Gynecology;  Laterality: Bilateral;  . CYSTOSCOPY N/A 11/13/2012   Procedure: CYSTOSCOPY;  Surgeon: Emily Filbert, MD;  Location: [facility];  Service: Gynecology;  Laterality: N/A;  . ROBOTIC ASSISTED TOTAL HYSTERECTOMY N/A 11/13/2012   Procedure: ROBOTIC ASSISTED TOTAL HYSTERECTOMY;  Surgeon: Emily Filbert, MD;  Location: [facility];  Service: Gynecology;  Laterality: N/A;  . TONSILLECTOMY      OB History    Gravida  3   Para  2   Term  1   Preterm  1   AB  1   Living  2     SAB  1   TAB  0   Ectopic  0   Multiple  0   Live Births               Home Medications    Prior to Admission medications   Medication Sig Start Date End Date Taking? Authorizing Provider    cyclobenzaprine (FLEXERIL) 10 MG tablet Take 1 tablet (10 mg total) by mouth 2 (two) times daily as needed for muscle spasms. 01/29/19   Quintella Reichert, MD  metroNIDAZOLE (FLAGYL) 500 MG tablet Take 1 tablet (500 mg total) by mouth 2 (two) times daily. 01/15/20   Chonda Baney, Madelynn Done C, PA-C  valACYclovir (VALTREX) 1000 MG tablet 2 po bid for 1 day 02/25/19   Brunetta Jeans, PA-C    Family History Family History  Problem Relation Age of Onset  . Hypertension Father   . Hypertension Mother   . Diabetes Mother   . Hypertension Brother   . Sarcoidosis Brother   . Kidney disease Brother   . Hypertension Brother   . Diabetes Brother     Social History Social History   Tobacco Use  . Smoking status: Current Every Day Smoker    Packs/day: 0.25  . Smokeless tobacco: Never Used  Substance Use Topics  . Alcohol use: Yes    Alcohol/week: 1.0 standard drink    Types: 1 Standard drinks or equivalent per week    Comment: socially  . Drug use: No     Allergies   Patient has no known allergies.   Review of Systems  Review of Systems  Constitutional: Negative for fever.  Respiratory: Negative for shortness of breath.   Cardiovascular: Negative for chest pain.  Gastrointestinal: Negative for abdominal pain, diarrhea, nausea and vomiting.  Genitourinary: Negative for dysuria, flank pain, genital sores, hematuria, menstrual problem, vaginal bleeding, vaginal discharge and vaginal pain.  Musculoskeletal: Negative for back pain.  Skin: Negative for rash.  Neurological: Negative for dizziness, light-headedness and headaches.     Physical Exam Triage Vital Signs ED Triage Vitals  Enc Vitals Group     BP 01/15/20 1216 126/76     Pulse Rate 01/15/20 1216 71     Resp 01/15/20 1216 18     Temp 01/15/20 1216 98.1 F (36.7 C)     Temp Source 01/15/20 1216 Oral     SpO2 01/15/20 1216 98 %     Weight --      Height --      Head Circumference --      Peak Flow --      Pain Score 01/15/20  1235 0     Pain Loc --      Pain Edu? --      Excl. in [place]? --    No data found.  Updated Vital Signs BP 126/76 (BP Location: Left Arm)   Pulse 71   Temp 98.1 F (36.7 C) (Oral)   Resp 18   LMP 11/04/2012   SpO2 98%   Visual Acuity Right Eye Distance:   Left Eye Distance:   Bilateral Distance:    Right Eye Near:   Left Eye Near:    Bilateral Near:     Physical Exam Vitals and nursing note reviewed.  Constitutional:      Appearance: She is well-developed.     Comments: No acute distress  HENT:     Head: Normocephalic and atraumatic.     Nose: Nose normal.  Eyes:     Conjunctiva/sclera: Conjunctivae normal.  Cardiovascular:     Rate and Rhythm: Normal rate.  Pulmonary:     Effort: Pulmonary effort is normal. No respiratory distress.  Abdominal:     General: There is no distension.  Musculoskeletal:        General: Normal range of motion.     Cervical back: Neck supple.  Skin:    General: Skin is warm and dry.  Neurological:     Mental Status: She is alert and oriented to person, place, and time.      UC Treatments / Results  Labs (all labs ordered are listed, but only abnormal results are displayed) Labs Reviewed  POCT URINALYSIS DIPSTICK, ED / UC - Abnormal; Notable for the following components:      Result Value   Hgb urine dipstick SMALL (*)    All other components within normal limits  CERVICOVAGINAL ANCILLARY ONLY    EKG   Radiology No results found.  Procedures Procedures (including critical care time)  Medications Ordered in UC Medications - No data to display  Initial Impression / Assessment and Plan / UC Course  I have reviewed the triage vital signs and the nursing notes.  Pertinent labs & imaging results that were available during my care of the patient were reviewed by me and considered in my medical decision making (see chart for details).     UA without leuks and nitrites, suspect likely vaginitis.  Vaginal swab pending for  screening of causes of irritation along with STD screening, will empirically treat for BV today and yeast with  Diflucan and metronidazole.  Will alter therapy as needed based off results.  Discussed strict return precautions. Patient verbalized understanding and is agreeable with plan.  Final Clinical Impressions(s) / UC Diagnoses   Final diagnoses:  Vaginitis and vulvovaginitis     Discharge Instructions     Begin metronidazole twice daily for 1 week to treat for BV.  1 tab of Diflucan today, may repeat after completion of metronidazole/Flagyl.  We are testing you for Gonorrhea, Chlamydia, Trichomonas, Yeast and Bacterial Vaginosis. We will call you if anything is positive and let you know if you require any further treatment. Please inform partners of any positive results.   Please return if symptoms not improving with treatment, development of fever, nausea, vomiting, abdominal pain.    ED Prescriptions    Medication Sig Dispense Auth. Provider   metroNIDAZOLE (FLAGYL) 500 MG tablet Take 1 tablet (500 mg total) by mouth 2 (two) times daily. 14 tablet Vyom Brass C, PA-C   fluconazole (DIFLUCAN) 150 MG tablet Take 1 tablet (150 mg total) by mouth once for 1 dose. 2 tablet Shyleigh Daughtry, [place] C, PA-C     PDMP not reviewed this encounter.   Janith Lima, PA-C 01/16/20 1014

## 2020-01-17 LAB — CERVICOVAGINAL ANCILLARY ONLY
Bacterial Vaginitis (gardnerella): POSITIVE — AB
Candida Glabrata: NEGATIVE
Candida Vaginitis: NEGATIVE
Chlamydia: NEGATIVE
Comment: NEGATIVE
Comment: NEGATIVE
Comment: NEGATIVE
Comment: NEGATIVE
Comment: NEGATIVE
Comment: NORMAL
Neisseria Gonorrhea: NEGATIVE
Trichomonas: NEGATIVE

## 2020-08-17 ENCOUNTER — Other Ambulatory Visit: Payer: Self-pay

## 2020-08-18 ENCOUNTER — Encounter: Payer: Self-pay | Admitting: Nurse Practitioner

## 2020-08-18 ENCOUNTER — Ambulatory Visit (INDEPENDENT_AMBULATORY_CARE_PROVIDER_SITE_OTHER): Payer: 59 | Admitting: Nurse Practitioner

## 2020-08-18 VITALS — BP 130/88 | HR 90 | Temp 97.1°F | Ht 64.0 in | Wt 279.2 lb

## 2020-08-18 DIAGNOSIS — Z136 Encounter for screening for cardiovascular disorders: Secondary | ICD-10-CM | POA: Diagnosis not present

## 2020-08-18 DIAGNOSIS — Q810 Epidermolysis bullosa simplex: Secondary | ICD-10-CM | POA: Diagnosis not present

## 2020-08-18 DIAGNOSIS — Z0000 Encounter for general adult medical examination without abnormal findings: Secondary | ICD-10-CM | POA: Diagnosis not present

## 2020-08-18 DIAGNOSIS — Z13220 Encounter for screening for lipoid disorders: Secondary | ICD-10-CM | POA: Diagnosis not present

## 2020-08-18 DIAGNOSIS — Z1231 Encounter for screening mammogram for malignant neoplasm of breast: Secondary | ICD-10-CM | POA: Diagnosis not present

## 2020-08-18 LAB — CBC WITH DIFFERENTIAL/PLATELET
Basophils Absolute: 0.1 10*3/uL (ref 0.0–0.1)
Basophils Relative: 1.1 % (ref 0.0–3.0)
Eosinophils Absolute: 0.2 10*3/uL (ref 0.0–0.7)
Eosinophils Relative: 2.4 % (ref 0.0–5.0)
HCT: 37.7 % (ref 36.0–46.0)
Hemoglobin: 12.3 g/dL (ref 12.0–15.0)
Lymphocytes Relative: 17.6 % (ref 12.0–46.0)
Lymphs Abs: 1.8 10*3/uL (ref 0.7–4.0)
MCHC: 32.5 g/dL (ref 30.0–36.0)
MCV: 88.1 fl (ref 78.0–100.0)
Monocytes Absolute: 0.7 10*3/uL (ref 0.1–1.0)
Monocytes Relative: 7 % (ref 3.0–12.0)
Neutro Abs: 7.4 10*3/uL (ref 1.4–7.7)
Neutrophils Relative %: 71.9 % (ref 43.0–77.0)
Platelets: 502 10*3/uL — ABNORMAL HIGH (ref 150.0–400.0)
RBC: 4.27 Mil/uL (ref 3.87–5.11)
RDW: 13.7 % (ref 11.5–15.5)
WBC: 10.3 10*3/uL (ref 4.0–10.5)

## 2020-08-18 LAB — LIPID PANEL
Cholesterol: 202 mg/dL — ABNORMAL HIGH (ref 0–200)
HDL: 56.8 mg/dL (ref >39.00)
LDL Cholesterol: 133 mg/dL — ABNORMAL HIGH (ref 0–99)
NonHDL: 145.02
Total CHOL/HDL Ratio: 4
Triglycerides: 62 mg/dL (ref 0.0–149.0)
VLDL: 12.4 mg/dL (ref 0.0–40.0)

## 2020-08-18 LAB — COMPREHENSIVE METABOLIC PANEL
ALT: 14 U/L (ref 0–35)
AST: 9 U/L (ref 0–37)
Albumin: 4 g/dL (ref 3.5–5.2)
Alkaline Phosphatase: 100 U/L (ref 39–117)
BUN: 11 mg/dL (ref 6–23)
CO2: 30 mEq/L (ref 19–32)
Calcium: 9.6 mg/dL (ref 8.4–10.5)
Chloride: 103 mEq/L (ref 96–112)
Creatinine, Ser: 0.74 mg/dL (ref 0.40–1.20)
GFR: 99.98 mL/min (ref >60.00)
Glucose, Bld: 98 mg/dL (ref 70–99)
Potassium: 4.5 mEq/L (ref 3.5–5.1)
Sodium: 139 mEq/L (ref 135–145)
Total Bilirubin: 0.4 mg/dL (ref 0.2–1.2)
Total Protein: 7.5 g/dL (ref 6.0–8.3)

## 2020-08-18 LAB — TSH: TSH: 2.25 u[IU]/mL (ref 0.35–4.50)

## 2020-08-18 NOTE — Patient Instructions (Addendum)
Go to lab for blood draw  You will be contacted to schedule appt for mammogram and with dermatology.  Start daily exercise and DASH diet  Thank you for choosing [facility]  Preventive Care 1-42 Years Old, Female Preventive care refers to lifestyle choices and visits with your health care provider that can promote health and wellness. This includes:  A yearly physical exam. This is also called an annual wellness visit.  Regular dental and eye exams.  Immunizations.  Screening for certain conditions.  Healthy lifestyle choices, such as: ? Eating a healthy diet. ? Getting regular exercise. ? Not using drugs or products that contain nicotine and tobacco. ? Limiting alcohol use. What can I expect for my preventive care visit? Physical exam Your health care provider will check your:  Height and weight. These may be used to calculate your BMI (body mass index). BMI is a measurement that tells if you are at a healthy weight.  Heart rate and blood pressure.  Body temperature.  Skin for abnormal spots. Counseling Your health care provider may ask you questions about your:  Past medical problems.  Family's medical history.  Alcohol, tobacco, and drug use.  Emotional well-being.  Home life and relationship well-being.  Sexual activity.  Diet, exercise, and sleep habits.  Work and work Statistician.  Access to firearms.  Method of birth control.  Menstrual cycle.  Pregnancy history. What immunizations do I need? Vaccines are usually given at various ages, according to a schedule. Your health care provider will recommend vaccines for you based on your age, medical history, and lifestyle or other factors, such as travel or where you work.   What tests do I need? Blood tests  Lipid and cholesterol levels. These may be checked every 5 years, or more often if you are over 62 years old.  Hepatitis C test.  Hepatitis B test. Screening  Lung cancer  screening. You may have this screening every year starting at age 65 if you have a 30-pack-year history of smoking and currently smoke or have quit within the past 15 years.  Colorectal cancer screening. ? All adults should have this screening starting at age 26 and continuing until age 57. ? Your health care provider may recommend screening at age 27 if you are at increased risk. ? You will have tests every 1-10 years, depending on your results and the type of screening test.  Diabetes screening. ? This is done by checking your blood sugar (glucose) after you have not eaten for a while (fasting). ? You may have this done every 1-3 years.  Mammogram. ? This may be done every 1-2 years. ? Talk with your health care provider about when you should start having regular mammograms. This may depend on whether you have a family history of breast cancer.  BRCA-related cancer screening. This may be done if you have a family history of breast, ovarian, tubal, or peritoneal cancers.  Pelvic exam and Pap test. ? This may be done every 3 years starting at age 42. ? Starting at age 56, this may be done every 5 years if you have a Pap test in combination with an HPV test. Other tests  STD (sexually transmitted disease) testing, if you are at risk.  Bone density scan. This is done to screen for osteoporosis. You may have this scan if you are at high risk for osteoporosis. Talk with your health care provider about your test results, treatment options, and if necessary, the need  for more tests. Follow these instructions at home: Eating and drinking  Eat a diet that includes fresh fruits and vegetables, whole grains, lean protein, and low-fat dairy products.  Take vitamin and mineral supplements as recommended by your health care provider.  Do not drink alcohol if: ? Your health care provider tells you not to drink. ? You are pregnant, may be pregnant, or are planning to become pregnant.  If you  drink alcohol: ? Limit how much you have to 0-1 drink a day. ? Be aware of how much alcohol is in your drink. In the [place] one drink equals one 12 oz bottle of beer (355 mL), one 5 oz glass of wine (148 mL), or one 1 oz glass of hard liquor (44 mL).   Lifestyle  Take daily care of your teeth and gums. Brush your teeth every morning and night with fluoride toothpaste. Floss one time each day.  Stay active. Exercise for at least 30 minutes 5 or more days each week.  Do not use any products that contain nicotine or tobacco, such as cigarettes, e-cigarettes, and chewing tobacco. If you need help quitting, ask your health care provider.  Do not use drugs.  If you are sexually active, practice safe sex. Use a condom or other form of protection to prevent STIs (sexually transmitted infections).  If you do not wish to become pregnant, use a form of birth control. If you plan to become pregnant, see your health care provider for a prepregnancy visit.  If told by your health care provider, take low-dose aspirin daily starting at age 83.  Find healthy ways to cope with stress, such as: ? Meditation, yoga, or listening to music. ? Journaling. ? Talking to a trusted person. ? Spending time with friends and family. Safety  Always wear your seat belt while driving or riding in a vehicle.  Do not drive: ? If you have been drinking alcohol. Do not ride with someone who has been drinking. ? When you are tired or distracted. ? While texting.  Wear a helmet and other protective equipment during sports activities.  If you have firearms in your house, make sure you follow all gun safety procedures. What's next?  Visit your health care provider once a year for an annual wellness visit.  Ask your health care provider how often you should have your eyes and teeth checked.  Stay up to date on all vaccines. This information is not intended to replace advice given to you by your health care provider.  Make sure you discuss any questions you have with your health care provider. Document Revised: 02/15/2020 Document Reviewed: 01/22/2018 Elsevier Patient Education  2021 [employer].

## 2020-08-18 NOTE — Progress Notes (Signed)
Subjective:    Patient ID: April Wolfe, female    DOB: [birth year], 40 y.o.   MRN: [id number]  Patient presents today for CPE   HPI Needs dermatology referral  Sexual History (orientation,birth control, marital status, STD):married, sexually active, no need for STD screen, needs order for annual mammograon, no need for PAP, s/p hysterectomy, ovaries still present.  Depression/Suicide: Depression screen [facility] 2/9 08/18/2020  Decreased Interest 0  Down, Depressed, Hopeless 0  PHQ - 2 Score 0  Altered sleeping 0  Tired, decreased energy 1  Change in appetite 1  Feeling bad or failure about yourself  0  Trouble concentrating 1  Moving slowly or fidgety/restless 0  Suicidal thoughts 0  PHQ-9 Score 3  Difficult doing work/chores Not difficult at all  Some encounter information is confidential and restricted. Go to Review Flowsheets activity to see all data.   Vision:will schedule  Dental:will schedule  Immunizations: (TDAP, Hep C screen, Pneumovax, Influenza, zoster)  Health Maintenance  Topic Date Due  . COVID-19 Vaccine (3 - Booster for Pfizer series) 07/09/2020  . Flu Shot  08/24/2020*  .  Hepatitis C: One time screening is recommended by Center for Disease Control  (CDC) for  adults born from [birth year] through 1967.   08/18/2021*  . HIV Screening  08/18/2021*  . Tetanus Vaccine  06/12/2023  . HPV Vaccine  Aged Out  . Pap Smear  Discontinued  *Topic was postponed. The date shown is not the original due date.   Diet:regular Exercise: none Weight:  Wt Readings from Last 3 Encounters:  08/18/20 279 lb 3.2 oz (126.6 kg)  01/29/19 260 lb (117.9 kg)  12/22/12 239 lb 3.2 oz (108.5 kg)   Fall Risk: Fall Risk  08/18/2020  Falls in the past year? 0  Number falls in past yr: 0  Injury with Fall? 0  Risk for fall due to : No Fall Risks  Follow up Falls evaluation completed  Some encounter information is confidential and restricted. Go to Review Flowsheets activity to see all  data.   Medications and allergies reviewed with patient and updated if appropriate.  Patient Active Problem List   Diagnosis Date Noted  . 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 Rash and nonspecific skin eruption 10/05/2016  . Anemia 11/13/2012  . Leiomyoma of uterus, unspecified 10/03/2011    Current Outpatient Medications on File Prior to Visit  Medication Sig Dispense Refill  . Probiotic Product (PROBIOTIC PO) Take by mouth.    . Turmeric (QC TUMERIC COMPLEX PO) Take by mouth.     No current facility-administered medications on file prior to visit.    Past Medical History:  Diagnosis Date  . Chlamydia 04/16/12  . DUB (dysfunctional uterine bleeding)   . Epidermolysis bullosa simplex   . Fibroids   . Gonorrhea 04/16/12  . No pertinent past medical history   . Obesity     Past Surgical History:  Procedure Laterality Date  . BILATERAL SALPINGECTOMY Bilateral 11/13/2012   Procedure: BILATERAL SALPINGECTOMY;  Surgeon: Emily Filbert, MD;  Location: [facility];  Service: Gynecology;  Laterality: Bilateral;  . CYSTOSCOPY N/A 11/13/2012   Procedure: CYSTOSCOPY;  Surgeon: Emily Filbert, MD;  Location: [facility];  Service: Gynecology;  Laterality: N/A;  . ROBOTIC ASSISTED TOTAL HYSTERECTOMY N/A 11/13/2012   Procedure: ROBOTIC ASSISTED TOTAL HYSTERECTOMY;  Surgeon: Emily Filbert, MD;  Location: [facility];  Service: Gynecology;  Laterality: N/A;  . TONSILLECTOMY      Social History  Socioeconomic History  . Marital status: Married    Spouse name: Not on file  . Number of children: Not on file  . Years of education: Not on file  . Highest education level: Not on file  Occupational History  . Not on file  Tobacco Use  . Smoking status: Former Smoker    Packs/day: 0.25    Types: Cigarettes    Quit date: 07/11/2020    Years since quitting: 0.1  . Smokeless tobacco: Never Used  Vaping Use  . Vaping Use: Never used  Substance and Sexual Activity  . Alcohol use: Yes    Alcohol/week:  1.0 standard drink    Types: 1 Standard drinks or equivalent per week    Comment: socially  . Drug use: No  . Sexual activity: Not Currently    Birth control/protection: Surgical  Other Topics Concern  . Not on file  Social History Narrative  . Not on file   Social Determinants of Health   Financial Resource Strain: Not on file  Food Insecurity: Not on file  Transportation Needs: Not on file  Physical Activity: Not on file  Stress: Not on file  Social Connections: Not on file    Family History  Problem Relation Age of Onset  . Hypertension Father   . Hypertension Mother   . Diabetes Mother   . Hypertension Brother   . Sarcoidosis Brother   . Kidney disease Brother   . Hypertension Brother   . Diabetes Brother        Review of Systems  Constitutional: Negative for fever, malaise/fatigue and weight loss.  HENT: Negative for congestion and sore throat.   Eyes:       Negative for visual changes  Respiratory: Negative for cough and shortness of breath.   Cardiovascular: Negative for chest pain, palpitations and leg swelling.  Gastrointestinal: Negative for blood in stool, constipation, diarrhea and heartburn.  Genitourinary: Negative for dysuria, frequency and urgency.  Musculoskeletal: Negative for falls, joint pain and myalgias.  Skin: Positive for itching and rash.  Neurological: Negative for dizziness, sensory change and headaches.  Endo/Heme/Allergies: Does not bruise/bleed easily.  Psychiatric/Behavioral: Negative for depression, substance abuse and suicidal ideas. The patient is not nervous/anxious.     Objective:   Vitals:   08/18/20 0809  BP: 130/88  Pulse: 90  Temp: (!) 97.1 F (36.2 C)  SpO2: 98%    Body mass index is 47.92 kg/m.   Physical Examination:  Physical Exam Vitals reviewed.  Constitutional:      General: She is not in acute distress.    Appearance: She is well-developed. She is obese.  HENT:     Right Ear: Tympanic membrane,  ear canal and external ear normal.     Left Ear: Tympanic membrane, ear canal and external ear normal.  Eyes:     Extraocular Movements: Extraocular movements intact.     Conjunctiva/sclera: Conjunctivae normal.  Cardiovascular:     Rate and Rhythm: Normal rate and regular rhythm.     Heart sounds: Normal heart sounds.  Pulmonary:     Effort: Pulmonary effort is normal. No respiratory distress.     Breath sounds: Normal breath sounds.  Chest:     Chest wall: No tenderness.  Breasts:     Right: Normal. No axillary adenopathy or supraclavicular adenopathy.     Left: Normal. No axillary adenopathy or supraclavicular adenopathy.    Abdominal:     General: Bowel sounds are normal.  Palpations: Abdomen is soft.  Genitourinary:    Comments: declined Musculoskeletal:        General: Normal range of motion.     Cervical back: Normal range of motion and neck supple.  Lymphadenopathy:     Upper Body:     Right upper body: No supraclavicular, axillary or pectoral adenopathy.     Left upper body: No supraclavicular, axillary or pectoral adenopathy.  Skin:    General: Skin is warm and dry.     Findings: Rash present. No erythema.  Neurological:     Mental Status: She is alert and oriented to person, place, and time.     Deep Tendon Reflexes: Reflexes are normal and symmetric.  Psychiatric:        Mood and Affect: Mood normal.        Behavior: Behavior normal.        Thought Content: Thought content normal.     ASSESSMENT and PLAN: This visit occurred during the SARS-CoV-2 public health emergency.  Safety protocols were in place, including screening questions prior to the visit, additional usage of staff PPE, and extensive cleaning of exam room while observing appropriate contact time as indicated for disinfecting solutions.   April Wolfe was seen today for establish care.  Diagnoses and all orders for this visit:  Preventative health care -     CBC with Differential/Platelet -      Comprehensive metabolic panel -     TSH -     Lipid panel -     MM 3D SCREEN BREAST BILATERAL; Future  Encounter for lipid screening for cardiovascular disease -     Lipid panel  Epidermolysis bullosa simplex -     Ambulatory referral to Dermatology  Breast cancer screening by mammogram -     MM 3D SCREEN BREAST BILATERAL; Future      Problem List Items Addressed This Visit      Musculoskeletal and Integument   Epidermolysis bullosa simplex   Relevant Orders   Ambulatory referral to Dermatology    Other Visit Diagnoses    Preventative health care    -  Primary   Relevant Orders   CBC with Differential/Platelet   Comprehensive metabolic panel   TSH   Lipid panel   MM 3D SCREEN BREAST BILATERAL   Encounter for lipid screening for cardiovascular disease       Relevant Orders   Lipid panel   Breast cancer screening by mammogram       Relevant Orders   MM 3D SCREEN BREAST BILATERAL      Follow up: Return in about 1 year (around 08/18/2021) for CPE (fasting).  Wilfred Lacy, NP

## 2020-09-06 ENCOUNTER — Other Ambulatory Visit (HOSPITAL_COMMUNITY): Payer: Self-pay

## 2020-09-06 DIAGNOSIS — L218 Other seborrheic dermatitis: Secondary | ICD-10-CM | POA: Diagnosis not present

## 2020-09-06 DIAGNOSIS — D235 Other benign neoplasm of skin of trunk: Secondary | ICD-10-CM | POA: Diagnosis not present

## 2020-09-06 MED ORDER — KETOCONAZOLE 2 % EX CREA
1.0000 "application " | TOPICAL_CREAM | Freq: Every evening | CUTANEOUS | 1 refills | Status: DC
  Filled 2020-09-06 – 2020-11-06 (×2): qty 30, 30d supply, fill #0

## 2020-09-06 MED ORDER — TRIAMCINOLONE ACETONIDE 0.025 % EX OINT
1.0000 | TOPICAL_OINTMENT | CUTANEOUS | 1 refills | Status: DC
Start: 2020-09-06 — End: 2022-06-21
  Filled 2020-09-06: qty 30, 15d supply, fill #0
  Filled 2020-11-06: qty 30, 20d supply, fill #0

## 2020-09-09 DIAGNOSIS — H52223 Regular astigmatism, bilateral: Secondary | ICD-10-CM | POA: Diagnosis not present

## 2020-09-09 DIAGNOSIS — H524 Presbyopia: Secondary | ICD-10-CM | POA: Diagnosis not present

## 2020-09-09 DIAGNOSIS — H5203 Hypermetropia, bilateral: Secondary | ICD-10-CM | POA: Diagnosis not present

## 2020-09-14 ENCOUNTER — Other Ambulatory Visit (HOSPITAL_COMMUNITY): Payer: Self-pay

## 2020-11-04 ENCOUNTER — Encounter (HOSPITAL_COMMUNITY): Payer: Self-pay

## 2020-11-04 ENCOUNTER — Other Ambulatory Visit: Payer: Self-pay

## 2020-11-04 ENCOUNTER — Ambulatory Visit (HOSPITAL_COMMUNITY)
Admission: EM | Admit: 2020-11-04 | Discharge: 2020-11-04 | Disposition: A | Discharge status: Home / Self Care | Payer: 59 | Attending: Medical Oncology | Admitting: Medical Oncology

## 2020-11-04 DIAGNOSIS — R21 Rash and other nonspecific skin eruption: Secondary | ICD-10-CM | POA: Diagnosis not present

## 2020-11-04 DIAGNOSIS — M79604 Pain in right leg: Secondary | ICD-10-CM | POA: Diagnosis not present

## 2020-11-04 MED ORDER — VALACYCLOVIR HCL 1 G PO TABS
1000.0000 mg | ORAL_TABLET | Freq: Three times a day (TID) | ORAL | 0 refills | Status: AC
  Filled 2020-11-06: qty 21, 7d supply, fill #0

## 2020-11-04 NOTE — ED Provider Notes (Signed)
[facility]    CSN: [id number] Arrival date & time: 11/04/20  1242      History   Chief Complaint Chief Complaint  Patient presents with   Leg Pain    HPI April Wolfe is a 40 y.o. female.   HPI  Right leg pain: Patient states that she was awoken last night by pain of her right lower anterior leg near her ankle.  She states it feels like a burning and tingling sensation that runs up her inner leg slightly and down to her foot some.  No known injuries.  The area is a bit red and she thinks there is a little bit of swelling in the area.  She thinks it was a little bit warm last night but has not noticed any changes recently.  She has not applied anything for symptoms.  She is concerned that this is a blood clot. She denies any chest pain, SOB or calf pain. She does have family members who have had blood clots.   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   Rash and nonspecific skin eruption 10/05/2016   Anemia 11/13/2012   Leiomyoma of uterus, unspecified 10/03/2011    Past Surgical History:  Procedure Laterality Date   BILATERAL SALPINGECTOMY Bilateral 11/13/2012   Procedure: BILATERAL SALPINGECTOMY;  Surgeon: Emily Filbert, MD;  Location: [facility];  Service: Gynecology;  Laterality: Bilateral;   CYSTOSCOPY N/A 11/13/2012   Procedure: CYSTOSCOPY;  Surgeon: Emily Filbert, MD;  Location: [facility];  Service: Gynecology;  Laterality: N/A;   ROBOTIC ASSISTED TOTAL HYSTERECTOMY N/A 11/13/2012   Procedure: ROBOTIC ASSISTED TOTAL HYSTERECTOMY;  Surgeon: Emily Filbert, MD;  Location: [facility];  Service: Gynecology;  Laterality: N/A;   TONSILLECTOMY      OB History     Gravida  3   Para  2   Term  1    Preterm  1   AB  1   Living  2      SAB  1   IAB  0   Ectopic  0   Multiple  0   Live Births               Home Medications    Prior to Admission medications   Medication Sig Start Date End Date Taking? Authorizing Provider  ketoconazole (NIZORAL) 2 % cream Apply to face, ears, hairline and chest every night at bedtime 09/06/20     Probiotic Product (PROBIOTIC PO) Take by mouth.    [provider]  triamcinolone (KENALOG) 0.025 % ointment Apply topically 2 (two) times daily to affected areas on face, ears, and chest for 1-2 weeks as needed for flares 09/06/20     Turmeric (QC TUMERIC COMPLEX PO) Take by mouth.    [provider]    Family History Family History  Problem Relation Age of Onset   Hypertension Father    Hypertension Mother    Diabetes Mother    Hypertension Brother    Sarcoidosis Brother    Kidney disease Brother    Hypertension Brother    Diabetes Brother     Social History Social History   Tobacco Use  Smoking status: Former    Packs/day: 0.25    Pack years: 0.00    Types: Cigarettes    Quit date: 07/11/2020    Years since quitting: 0.3   Smokeless tobacco: Never  Vaping Use   Vaping Use: Never used  Substance Use Topics   Alcohol use: Yes    Alcohol/week: 1.0 standard drink    Types: 1 Standard drinks or equivalent per week    Comment: socially   Drug use: No     Allergies   Patient has no known allergies.   Review of Systems Review of Systems  As stated above in HPI Physical Exam Triage Vital Signs ED Triage Vitals  Enc Vitals Group     BP 11/04/20 1305 136/70     Pulse Rate 11/04/20 1305 92     Resp 11/04/20 1305 19     Temp 11/04/20 1305 98.2 F (36.8 C)     Temp Source 11/04/20 1305 Oral     SpO2 11/04/20 1305 99 %     Weight --      Height --      Head Circumference --      Peak Flow --      Pain Score 11/04/20 1303 6     Pain Loc --      Pain Edu? --      Excl. in [place]? --    No data  found.  Updated Vital Signs BP 136/70 (BP Location: Right Arm)   Pulse 92   Temp 98.2 F (36.8 C) (Oral)   Resp 19   LMP 11/04/2012   SpO2 99%    Physical Exam Vitals and nursing note reviewed.  Constitutional:      Appearance: Normal appearance.  Cardiovascular:     Pulses: Normal pulses.  Musculoskeletal:        General: Tenderness (skin tenderness) present. No swelling. Normal range of motion.     Right lower leg: No edema.     Left lower leg: No edema.  Skin:    General: Skin is warm.     Capillary Refill: Capillary refill takes less than 2 seconds.  Neurological:     General: No focal deficit present.     Mental Status: She is alert and oriented to person, place, and time.     Sensory: No sensory deficit.     Motor: No weakness.     Gait: Gait normal.        UC Treatments / Results  Labs (all labs ordered are listed, but only abnormal results are displayed) Labs Reviewed - No data to display  EKG   Radiology No results found.  Procedures Procedures (including critical care time)  Medications Ordered in UC Medications - No data to display  Initial Impression / Assessment and Plan / UC Course  I have reviewed the triage vital signs and the nursing notes.  Pertinent labs & imaging results that were available during my care of the patient were reviewed by me and considered in my medical decision making (see chart for details).     New.  Wells criteria score is 0 points.  I discussed with patient that I do not suspect that this is a blood clot however if it would make her feel better the only way to confirm is through an ultrasound at the hospital.  Instead I think that this likely represents early shingles given the slightly vesicular rash that appears to be appearing and the description of the tingling  rash that radiates up the nerve pathway.  We discussed Valtrex and shingles.  We also discussed potential for cellulitis however at this time the most  likely diagnosis is shingles emergent a proceed forward with antiviral treatment as she is within treatment window.  Discussed red flag signs and symptoms. Final Clinical Impressions(s) / UC Diagnoses   Final diagnoses:  None   Discharge Instructions   None    ED Prescriptions   None    PDMP not reviewed this encounter.   Hughie Closs, [place] 11/04/20 1335

## 2020-11-04 NOTE — ED Triage Notes (Signed)
Pt in with c/o pain in right lower leg since yesterday  Pt states redness, warmth, and painful to touch  Pt denies any recent injuries or falls

## 2020-11-06 ENCOUNTER — Other Ambulatory Visit (HOSPITAL_COMMUNITY): Payer: Self-pay

## 2021-01-23 ENCOUNTER — Inpatient Hospital Stay: Admission: RE | Admit: 2021-01-23 | Payer: 59 | Source: Ambulatory Visit

## 2021-05-22 ENCOUNTER — Other Ambulatory Visit: Payer: Self-pay

## 2021-05-22 ENCOUNTER — Emergency Department (HOSPITAL_BASED_OUTPATIENT_CLINIC_OR_DEPARTMENT_OTHER)
Admission: EM | Admit: 2021-05-22 | Discharge: 2021-05-22 | Disposition: A | Discharge status: Home / Self Care | Payer: 59 | Attending: Emergency Medicine | Admitting: Emergency Medicine

## 2021-05-22 ENCOUNTER — Emergency Department (HOSPITAL_BASED_OUTPATIENT_CLINIC_OR_DEPARTMENT_OTHER): Payer: 59

## 2021-05-22 ENCOUNTER — Encounter (HOSPITAL_BASED_OUTPATIENT_CLINIC_OR_DEPARTMENT_OTHER): Payer: Self-pay | Admitting: Emergency Medicine

## 2021-05-22 DIAGNOSIS — Z87891 Personal history of nicotine dependence: Secondary | ICD-10-CM | POA: Insufficient documentation

## 2021-05-22 DIAGNOSIS — R109 Unspecified abdominal pain: Secondary | ICD-10-CM | POA: Diagnosis not present

## 2021-05-22 DIAGNOSIS — M545 Low back pain, unspecified: Secondary | ICD-10-CM | POA: Insufficient documentation

## 2021-05-22 DIAGNOSIS — N201 Calculus of ureter: Secondary | ICD-10-CM | POA: Diagnosis not present

## 2021-05-22 DIAGNOSIS — R1031 Right lower quadrant pain: Secondary | ICD-10-CM | POA: Diagnosis not present

## 2021-05-22 DIAGNOSIS — R112 Nausea with vomiting, unspecified: Secondary | ICD-10-CM | POA: Diagnosis not present

## 2021-05-22 LAB — URINALYSIS, ROUTINE W REFLEX MICROSCOPIC
Bilirubin Urine: NEGATIVE
Glucose, UA: NEGATIVE mg/dL
Ketones, ur: NEGATIVE mg/dL
Leukocytes,Ua: NEGATIVE
Nitrite: NEGATIVE
Protein, ur: 30 mg/dL — AB
RBC / HPF: >50 RBC/hpf — ABNORMAL HIGH (ref 0–5)
Specific Gravity, Urine: >1.046 — ABNORMAL HIGH (ref 1.005–1.030)
pH: 5.5 (ref 5.0–8.0)

## 2021-05-22 LAB — CBC
HCT: 39.8 % (ref 36.0–46.0)
Hemoglobin: 12.6 g/dL (ref 12.0–15.0)
MCH: 27.7 pg (ref 26.0–34.0)
MCHC: 31.7 g/dL (ref 30.0–36.0)
MCV: 87.5 fL (ref 80.0–100.0)
Platelets: 528 10*3/uL — ABNORMAL HIGH (ref 150–400)
RBC: 4.55 MIL/uL (ref 3.87–5.11)
RDW: 13.2 % (ref 11.5–15.5)
WBC: 11.4 10*3/uL — ABNORMAL HIGH (ref 4.0–10.5)
nRBC: 0 % (ref 0.0–0.2)

## 2021-05-22 LAB — LIPASE, BLOOD: Lipase: <10 U/L — ABNORMAL LOW (ref 11–51)

## 2021-05-22 LAB — COMPREHENSIVE METABOLIC PANEL
ALT: 13 U/L (ref 0–44)
AST: 10 U/L — ABNORMAL LOW (ref 15–41)
Albumin: 4.1 g/dL (ref 3.5–5.0)
Alkaline Phosphatase: 100 U/L (ref 38–126)
Anion gap: 8 (ref 5–15)
BUN: 11 mg/dL (ref 6–20)
CO2: 26 mmol/L (ref 22–32)
Calcium: 9.5 mg/dL (ref 8.9–10.3)
Chloride: 102 mmol/L (ref 98–111)
Creatinine, Ser: 0.74 mg/dL (ref 0.44–1.00)
GFR, Estimated: >60 mL/min (ref >60)
Glucose, Bld: 110 mg/dL — ABNORMAL HIGH (ref 70–99)
Potassium: 3.8 mmol/L (ref 3.5–5.1)
Sodium: 136 mmol/L (ref 135–145)
Total Bilirubin: 0.3 mg/dL (ref 0.3–1.2)
Total Protein: 8.2 g/dL — ABNORMAL HIGH (ref 6.5–8.1)

## 2021-05-22 LAB — HCG, QUANTITATIVE, PREGNANCY: hCG, Beta Chain, Quant, S: <1 m[IU]/mL (ref <5)

## 2021-05-22 MED ORDER — ONDANSETRON 4 MG PO TBDP: 4.0000 mg | ORAL_TABLET | Freq: Once | ORAL | Status: DC | PRN

## 2021-05-22 MED ORDER — IOHEXOL 300 MG/ML  SOLN
100.0000 mL | Freq: Once | INTRAMUSCULAR | Status: AC | PRN
  Administered 2021-05-22: 19:00:00 100 mL via INTRAVENOUS

## 2021-05-22 MED ORDER — KETOROLAC TROMETHAMINE 30 MG/ML IJ SOLN
30.0000 mg | Freq: Once | INTRAMUSCULAR | Status: AC
  Administered 2021-05-22: 20:00:00 30 mg via INTRAVENOUS
  Filled 2021-05-22: qty 1

## 2021-05-22 MED ORDER — SODIUM CHLORIDE 0.9 % IV BOLUS
1000.0000 mL | Freq: Once | INTRAVENOUS | Status: AC
  Administered 2021-05-22: 19:00:00 1000 mL via INTRAVENOUS

## 2021-05-22 MED ORDER — MORPHINE SULFATE (PF) 4 MG/ML IV SOLN
4.0000 mg | Freq: Once | INTRAVENOUS | Status: AC
  Administered 2021-05-22: 19:00:00 4 mg via INTRAVENOUS
  Filled 2021-05-22: qty 1

## 2021-05-22 MED ORDER — ONDANSETRON HCL 4 MG/2ML IJ SOLN
4.0000 mg | Freq: Once | INTRAMUSCULAR | Status: AC | PRN
  Administered 2021-05-22: 17:00:00 4 mg via INTRAVENOUS
  Filled 2021-05-22: qty 2

## 2021-05-22 MED ORDER — TAMSULOSIN HCL 0.4 MG PO CAPS
0.4000 mg | ORAL_CAPSULE | Freq: Every day | ORAL | 0 refills | Status: AC
  Filled 2021-05-22: qty 10, 10d supply, fill #0

## 2021-05-22 MED ORDER — HYDROCODONE-ACETAMINOPHEN 5-325 MG PO TABS
1.0000 | ORAL_TABLET | Freq: Once | ORAL | Status: AC
  Administered 2021-05-22: 23:00:00 1 via ORAL
  Filled 2021-05-22: qty 1

## 2021-05-22 MED ORDER — ONDANSETRON HCL 4 MG/2ML IJ SOLN
4.0000 mg | Freq: Once | INTRAMUSCULAR | Status: AC
  Administered 2021-05-22: 19:00:00 4 mg via INTRAVENOUS
  Filled 2021-05-22: qty 2

## 2021-05-22 MED ORDER — HYDROMORPHONE HCL 1 MG/ML IJ SOLN
1.0000 mg | Freq: Once | INTRAMUSCULAR | Status: AC
  Administered 2021-05-22: 19:00:00 1 mg via INTRAVENOUS
  Filled 2021-05-22: qty 1

## 2021-05-22 MED ORDER — HYDROCODONE-ACETAMINOPHEN 5-325 MG PO TABS
1.0000 | ORAL_TABLET | ORAL | 0 refills | Status: DC | PRN
  Filled 2021-05-22: qty 10, 2d supply, fill #0

## 2021-05-22 NOTE — ED Provider Notes (Signed)
40 yo female with distant hx nephrolithiasis to ED today with flank pain, concern for abdominal pain since yesterday. See PA note for full details. Pt found to have small kidney stone, likely will pass spontaneously. Renal function stable. She is able to tolerate PO. She is urinating spontaneously. Pain well controlled currently. UA without infection. Will discharge on analgesics, flomax, and close urology f/u in around 1 wk. PCP f/u as well.  The patient improved significantly and was discharged in stable condition. Detailed discussions were had with the patient regarding current findings, and need for close f/u with PCP or on call doctor. The patient has been instructed to return immediately if the symptoms worsen in any way for re-evaluation. Patient verbalized understanding and is in agreement with current care plan. All questions answered prior to discharge.     Jeanell Sparrow, DO 05/22/21 2238

## 2021-05-22 NOTE — ED Provider Notes (Signed)
[facility] EMERGENCY DEPT Provider Note   CSN: [id number] Arrival date & time: 05/22/21  1635    History Chief Complaint  Patient presents with   Abdominal Pain    April Wolfe is a 40 y.o. female with no significant past medical history who presents for evaluation of right lower quadrant pain.  Began yesterday.  Initially intermittent in nature however now persistent.  Does states she also has some mild lower back and flank pain, no history of stones.  No burning with urination, hematuria.  History of hysterectomy however still has Bl ovaries.  Has had multiple episodes of NBNB emesis.  Multiple episodes of loose stools however denies any diarrhea, melena or [place] red blood per rectum.  No fever, headache, sore throat, chest pain, shortness of breath.  She denies any vaginal discharge or any concerns for any STDs.  Denies additional aggravating or alleviating factors.  Rates her pain a 9/10.  History obtained from patient and past medical records.  No Interpreter is used.  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   Rash and nonspecific skin eruption 10/05/2016   Anemia 11/13/2012   Leiomyoma of uterus, unspecified 10/03/2011    Past Surgical History:  Procedure Laterality Date   BILATERAL SALPINGECTOMY Bilateral 11/13/2012   Procedure: BILATERAL SALPINGECTOMY;  Surgeon: Emily Filbert, MD;  Location: [facility];  Service: Gynecology;  Laterality: Bilateral;   CYSTOSCOPY N/A 11/13/2012   Procedure: CYSTOSCOPY;  Surgeon: Emily Filbert, MD;  Location: [facility];  Service: Gynecology;  Laterality: N/A;   ROBOTIC ASSISTED TOTAL HYSTERECTOMY N/A 11/13/2012   Procedure: ROBOTIC ASSISTED TOTAL  HYSTERECTOMY;  Surgeon: Emily Filbert, MD;  Location: [facility];  Service: Gynecology;  Laterality: N/A;   TONSILLECTOMY       OB History     Gravida  3   Para  2   Term  1   Preterm  1   AB  1   Living  2      SAB  1   IAB  0   Ectopic  0   Multiple  0   Live Births              Family History  Problem Relation Age of Onset   Hypertension Father    Hypertension Mother    Diabetes Mother    Hypertension Brother    Sarcoidosis Brother    Kidney disease Brother    Hypertension Brother    Diabetes Brother     Social History   Tobacco Use   Smoking status: Former    Packs/day: 0.25    Types: Cigarettes    Quit date: 07/11/2020    Years since quitting: 0.8   Smokeless tobacco: Never  Vaping Use   Vaping Use: Never used  Substance Use Topics   Alcohol use: Yes    Alcohol/week: 1.0 standard drink    Types: 1 Standard drinks or equivalent per week    Comment: socially   Drug use: No    Home Medications Prior to Admission medications   Medication Sig Start Date End Date Taking? Authorizing Provider  ketoconazole (NIZORAL) 2 % cream  Apply to face, ears, hairline and chest every night at bedtime 09/06/20     Probiotic Product (PROBIOTIC PO) Take by mouth.    [provider]  triamcinolone (KENALOG) 0.025 % ointment Apply topically 2 (two) times daily to affected areas on face, ears, and chest for 1-2 weeks as needed for flares 09/06/20     Turmeric (QC TUMERIC COMPLEX PO) Take by mouth.    [provider]    Allergies    Patient has no known allergies.  Review of Systems   Review of Systems  Constitutional: Negative.   HENT: Negative.    Respiratory: Negative.    Cardiovascular: Negative.   Gastrointestinal:  Positive for abdominal pain, nausea and vomiting. Negative for abdominal distention, anal bleeding, blood in stool, constipation, diarrhea and rectal pain.  Genitourinary: Negative.   Musculoskeletal: Negative.   Skin:  Negative.   Neurological: Negative.   All other systems reviewed and are negative.  Physical Exam Updated Vital Signs BP 124/71 (BP Location: Right Arm)    Pulse 70    Temp 97.7 F (36.5 C)    Resp 16    Wt 117.9 kg    LMP 11/04/2012    SpO2 98%    BMI 44.63 kg/m   Physical Exam Vitals and nursing note reviewed.  Constitutional:      General: She is not in acute distress.    Appearance: She is well-developed. She is obese. She is not ill-appearing, toxic-appearing or diaphoretic.  HENT:     Head: Normocephalic and atraumatic.  Eyes:     Pupils: Pupils are equal, round, and reactive to light.  Cardiovascular:     Rate and Rhythm: Normal rate.     Heart sounds: Normal heart sounds.  Pulmonary:     Effort: Pulmonary effort is normal. No respiratory distress.     Breath sounds: Normal breath sounds.  Abdominal:     General: Bowel sounds are normal. There is no distension.     Tenderness: There is abdominal tenderness in the right lower quadrant and suprapubic area. There is no guarding or rebound.     Hernia: No hernia is present.     Comments: Difficult exam due to body habitus however tenderness to right lower quadrant, suprapubic, right lower back, flank.  Musculoskeletal:        General: Normal range of motion.     Cervical back: Normal range of motion.  Skin:    General: Skin is warm and dry.  Neurological:     General: No focal deficit present.     Mental Status: She is alert.  Psychiatric:        Mood and Affect: Mood normal.    ED Results / Procedures / Treatments   Labs (all labs ordered are listed, but only abnormal results are displayed) Labs Reviewed  LIPASE, BLOOD - Abnormal; Notable for the following components:      Result Value   Lipase <10 (*)    All other components within normal limits  COMPREHENSIVE METABOLIC PANEL - Abnormal; Notable for the following components:   Glucose, Bld 110 (*)    Total Protein 8.2 (*)    AST 10 (*)    All other  components within normal limits  CBC - Abnormal; Notable for the following components:   WBC 11.4 (*)    Platelets 528 (*)    All other components within normal limits  HCG, QUANTITATIVE, PREGNANCY  URINALYSIS, ROUTINE W REFLEX MICROSCOPIC    EKG  None  Radiology CT Abdomen Pelvis W Contrast  Result Date: 05/22/2021 CLINICAL DATA:  Right lower quadrant pain. EXAM: CT ABDOMEN AND PELVIS WITH CONTRAST TECHNIQUE: Multidetector CT imaging of the abdomen and pelvis was performed using the standard protocol following bolus administration of intravenous contrast. CONTRAST:  157mL OMNIPAQUE IOHEXOL 300 MG/ML  SOLN COMPARISON:  December 22, 2012 FINDINGS: Lower chest: No acute abnormality. Hepatobiliary: No focal liver abnormality is seen. No gallstones, gallbladder wall thickening, or biliary dilatation. Pancreas: Unremarkable. No pancreatic ductal dilatation or surrounding inflammatory changes. Spleen: Normal in size without focal abnormality. Adrenals/Urinary Tract: Adrenal glands are unremarkable. Kidneys are normal in size, without focal lesions. A 3 mm obstructing renal stone is seen within the distal right ureter (axial CT image 74, CT series 2), with mild right-sided hydronephrosis and hydroureter. Delayed renal cortical enhancement is also seen on the right bladder is unremarkable. Stomach/Bowel: Stomach is within normal limits. Appendix appears normal. No evidence of bowel wall thickening, distention, or inflammatory changes. Vascular/Lymphatic: No significant vascular findings are present. No enlarged abdominal or pelvic lymph nodes. Reproductive: Status post hysterectomy. No adnexal masses. Other: No abdominal wall hernia or abnormality. No abdominopelvic ascites. Musculoskeletal: No acute or significant osseous findings. IMPRESSION: 3 mm obstructing renal stone within the distal right ureter. Electronically Signed   By: Virgina Norfolk M.D.   On: 05/22/2021 19:25    Procedures Procedures    Medications Ordered in ED Medications  ondansetron (ZOFRAN) injection 4 mg (4 mg Intravenous Given 05/22/21 1721)  sodium chloride 0.9 % bolus 1,000 mL (0 mLs Intravenous Stopped 05/22/21 2209)  ondansetron (ZOFRAN) injection 4 mg (4 mg Intravenous Given 05/22/21 1853)  morphine 4 MG/ML injection 4 mg (4 mg Intravenous Given 05/22/21 1854)  iohexol (OMNIPAQUE) 300 MG/ML solution 100 mL (100 mLs Intravenous Contrast Given 05/22/21 1904)  HYDROmorphone (DILAUDID) injection 1 mg (1 mg Intravenous Given 05/22/21 1925)  ketorolac (TORADOL) 30 MG/ML injection 30 mg (30 mg Intravenous Given 05/22/21 1959)    ED Course  I have reviewed the triage vital signs and the nursing notes.  Pertinent labs & imaging results that were available during my care of the patient were reviewed by me and considered in my medical decision making (see chart for details).  Pleasant 40 year old here for evaluation of NBNB emesis and right lower quadrant abdominal pain.  She is afebrile, nonseptic appearing.  Active emesis in room despite dose of Zofran by triage.  Difficult abdominal exam due to body habitus however tenderness to right lower quadrant, right lower back.  Denies any urinary complaints, hematuria.  Labs and imaging personally reviewed and interpreted:  CBC with leukocytosis at 26.7 Metabolic panel mild hyperglycemia at 110, no additional electrolyte, renal or liver abnormality Lipase less than 10 Pregnancy test neg CTAP 44mm right distal ureter stone  Patient reassessed.  Nausea and vomiting improved, still has some pain will give Toradol given CT scan with stone  Patient reassessed.  Pain, vomiting improved.  Pending UA   Care transferred to Dr. Pearline Cables who will follow up on UA and determine disposition.  Suspect if tolerating p.o. intake, urine does not show any evidence of infection DC home with symptomatic management, PCP, urology follow-up outpatient.     MDM Rules/Calculators/A&P                              Final Clinical Impression(s) / ED Diagnoses Final diagnoses:  Ureteral stone    Rx /  DC Orders ED Discharge Orders     None        Kella Splinter A, PA-C 05/22/21 [place], DO 05/23/21 0015    Jeanell Sparrow, DO 05/23/21 2591

## 2021-05-22 NOTE — ED Notes (Signed)
Patient transported to CT 

## 2021-05-22 NOTE — Discharge Instructions (Addendum)
It was a pleasure taking care of you here in the emergency department  Your CT scan showed a stone in your ureter which seems like it is about to pass.  We have prescribed you for a few medications to help with your symptoms.  Take as prescribed.  Follow-up with urology, primary care provider if symptoms unimproved  Return to the emergency department for worsening symptoms

## 2021-05-22 NOTE — ED Triage Notes (Signed)
Reports RLQ pain radiating to back and groin area , emesis , multiple normal BM since yesterday .  No Hx kidney stones , denies urinary symptoms

## 2021-05-23 ENCOUNTER — Other Ambulatory Visit (HOSPITAL_COMMUNITY): Payer: Self-pay

## 2022-01-09 ENCOUNTER — Telehealth: Payer: Self-pay | Admitting: Nurse Practitioner

## 2022-01-09 DIAGNOSIS — U071 COVID-19: Secondary | ICD-10-CM

## 2022-01-09 NOTE — Progress Notes (Signed)
Must do a urgent care video visit in order to get treatment for covid.

## 2022-06-21 ENCOUNTER — Ambulatory Visit (INDEPENDENT_AMBULATORY_CARE_PROVIDER_SITE_OTHER): Payer: 59 | Admitting: Nurse Practitioner

## 2022-06-21 ENCOUNTER — Other Ambulatory Visit (HOSPITAL_COMMUNITY): Payer: Self-pay

## 2022-06-21 ENCOUNTER — Encounter: Payer: Self-pay | Admitting: Nurse Practitioner

## 2022-06-21 DIAGNOSIS — F17200 Nicotine dependence, unspecified, uncomplicated: Secondary | ICD-10-CM | POA: Diagnosis not present

## 2022-06-21 DIAGNOSIS — Z87891 Personal history of nicotine dependence: Secondary | ICD-10-CM | POA: Insufficient documentation

## 2022-06-21 DIAGNOSIS — R030 Elevated blood-pressure reading, without diagnosis of hypertension: Secondary | ICD-10-CM | POA: Diagnosis not present

## 2022-06-21 DIAGNOSIS — I10 Essential (primary) hypertension: Secondary | ICD-10-CM | POA: Insufficient documentation

## 2022-06-21 LAB — COMPREHENSIVE METABOLIC PANEL
ALT: 13 U/L (ref 0–35)
AST: 9 U/L (ref 0–37)
Albumin: 4.1 g/dL (ref 3.5–5.2)
Alkaline Phosphatase: 114 U/L (ref 39–117)
BUN: 11 mg/dL (ref 6–23)
CO2: 31 mEq/L (ref 19–32)
Calcium: 10.3 mg/dL (ref 8.4–10.5)
Chloride: 102 mEq/L (ref 96–112)
Creatinine, Ser: 0.63 mg/dL (ref 0.40–1.20)
GFR: 108.21 mL/min (ref >60.00)
Glucose, Bld: 124 mg/dL — ABNORMAL HIGH (ref 70–99)
Potassium: 4.4 mEq/L (ref 3.5–5.1)
Sodium: 143 mEq/L (ref 135–145)
Total Bilirubin: 0.2 mg/dL (ref 0.2–1.2)
Total Protein: 7.8 g/dL (ref 6.0–8.3)

## 2022-06-21 LAB — LIPID PANEL
Cholesterol: 217 mg/dL — ABNORMAL HIGH (ref 0–200)
HDL: 60 mg/dL (ref >39.00)
LDL Cholesterol: 138 mg/dL — ABNORMAL HIGH (ref 0–99)
NonHDL: 156.81
Total CHOL/HDL Ratio: 4
Triglycerides: 95 mg/dL (ref 0.0–149.0)
VLDL: 19 mg/dL (ref 0.0–40.0)

## 2022-06-21 MED ORDER — ZEPBOUND 2.5 MG/0.5ML ~~LOC~~ SOAJ
2.5000 mg | SUBCUTANEOUS | 0 refills | Status: DC
  Filled 2022-06-21 – 2022-06-26 (×2): qty 2, 28d supply, fill #0

## 2022-06-21 NOTE — Progress Notes (Signed)
Established Patient Visit  Patient: April Wolfe   DOB: 03-20-1981   42 y.o. Female  MRN: [id number] Visit Date: 06/21/2022  Subjective:    Chief Complaint  Patient presents with   Office Visit    Weight loss options  No other concerns  Needs mammo order    HPI Tobacco use disorder Quit for 1year after smoking 1ppd x 2yr. Resume use 1year ago, now smoke 1/4ppd. Trigger: stress and around mother who also smokes in the house. She smokes in her home only.  Advised about effects on BP, lung disease, heart disease and cancer. Advised to quit.  Morbid obesity ([place] Diet: home cooked meals, small meal portion, lean protein x 658monthExercise: walking 3x/week, 30-453m each x1year No weight loss noted with above lifestyle modifications BP Readings from Last 3 Encounters:  06/21/22 (!) 144/86  05/22/21 130/80  11/04/20 136/70    Wt Readings from Last 3 Encounters:  06/21/22 273 lb 6.4 oz (124 kg)  05/22/21 260 lb (117.9 kg)  08/18/20 279 lb 3.2 oz (126.6 kg)    Declined need for nutritionist. We discussed use of GLP injection and possible side effects. She verbalized understanding and agreed to start med. Check CMP and lipid panel F/up in 19mo45monthevated BP without diagnosis of hypertension With AM headache. BP Readings from Last 3 Encounters:  06/21/22 (!) 158/84  05/22/21 130/80  11/04/20 136/70    Check CMP Stop tobacco use Maintain low sodium diet Monitor BP in AM daily. Send BP reading via mychart in 1week F/up in office in 19mon319monthewed medical, surgical, and social history today  Medications: Outpatient Medications Prior to Visit  Medication Sig   [DISCONTINUED] HYDROcodone-acetaminophen (NORCO/VICODIN) 5-325 MG tablet Take 1 tablet by mouth every 4 (four) hours as needed. (Patient not taking: Reported on 06/21/2022)   [DISCONTINUED] ketoconazole (NIZORAL) 2 % cream Apply to face, ears, hairline and chest every night at bedtime (Patient  not taking: Reported on 06/21/2022)   [DISCONTINUED] Probiotic Product (PROBIOTIC PO) Take by mouth. (Patient not taking: Reported on 06/21/2022)   [DISCONTINUED] triamcinolone (KENALOG) 0.025 % ointment Apply topically 2 (two) times daily to affected areas on face, ears, and chest for 1-2 weeks as needed for flares (Patient not taking: Reported on 06/21/2022)   [DISCONTINUED] Turmeric (QC TUMERIC COMPLEX PO) Take by mouth. (Patient not taking: Reported on 06/21/2022)   No facility-administered medications prior to visit.   Reviewed past medical and social history.   ROS per HPI above      Objective:  BP (!) 158/84   Pulse 93   Temp 98.3 F (36.8 C) (Temporal)   Ht '5\' 4"'$  (1.626 m)   Wt 273 lb 6.4 oz (124 kg)   LMP 11/04/2012   SpO2 96%   BMI 46.93 kg/m      Physical Exam Vitals and nursing note reviewed.  Constitutional:      Appearance: She is obese.  Cardiovascular:     Rate and Rhythm: Normal rate and regular rhythm.     Pulses: Normal pulses.     Heart sounds: Normal heart sounds.  Pulmonary:     Effort: Pulmonary effort is normal.     Breath sounds: Normal breath sounds.  Musculoskeletal:     Right lower leg: No edema.     Left lower leg: No edema.  Neurological:     Mental Status: She is alert and oriented to person,  place, and time.     No results found for any visits on 06/21/22.    Assessment & Plan:    Problem List Items Addressed This Visit       Other   Elevated BP without diagnosis of hypertension    With AM headache. BP Readings from Last 3 Encounters:  06/21/22 (!) 158/84  05/22/21 130/80  11/04/20 136/70    Check CMP Stop tobacco use Maintain low sodium diet Monitor BP in AM daily. Send BP reading via mychart in 1week F/up in office in 43month     Morbid obesity ([place] - Primary    Diet: home cooked meals, small meal portion, lean protein x 623monthExercise: walking 3x/week, 30-4584m each x1year No weight loss noted with above  lifestyle modifications BP Readings from Last 3 Encounters:  06/21/22 (!) 144/86  05/22/21 130/80  11/04/20 136/70    Wt Readings from Last 3 Encounters:  06/21/22 273 lb 6.4 oz (124 kg)  05/22/21 260 lb (117.9 kg)  08/18/20 279 lb 3.2 oz (126.6 kg)    Declined need for nutritionist. We discussed use of GLP injection and possible side effects. She verbalized understanding and agreed to start med. Check CMP and lipid panel F/up in 27mo58month  Relevant Medications   tirzepatide (ZEPBOUND) 2.5 MG/0.5ML Pen   Other Relevant Orders   Comprehensive metabolic panel   Lipid panel   Tobacco use disorder    Quit for 1year after smoking 1ppd x 64yr27yrsume use 1year ago, now smoke 1/4ppd. Trigger: stress and around mother who also smokes in the house. She smokes in her home only.  Advised about effects on BP, lung disease, heart disease and cancer. Advised to quit.      Return in about 4 weeks (around 07/19/2022) for HTN, Weight management.     CharlWilfred Lacy

## 2022-06-21 NOTE — Patient Instructions (Signed)
Go to lab Stop tobacco use Maintain low sodium diet Monitor BP in AM daily. Send BP reading via mychart in 1week  DASH Eating Plan DASH stands for Dietary Approaches to Stop Hypertension. The DASH eating plan is a healthy eating plan that has been shown to: Reduce high blood pressure (hypertension). Reduce your risk for type 2 diabetes, heart disease, and stroke. Help with weight loss. What are tips for following this plan? Reading food labels Check food labels for the amount of salt (sodium) per serving. Choose foods with less than 5 percent of the Daily Value of sodium. Generally, foods with less than 300 milligrams (mg) of sodium per serving fit into this eating plan. To find whole grains, look for the word "whole" as the first word in the ingredient list. Shopping Buy products labeled as "low-sodium" or "no salt added." Buy fresh foods. Avoid canned foods and pre-made or frozen meals. Cooking Avoid adding salt when cooking. Use salt-free seasonings or herbs instead of table salt or sea salt. Check with your health care provider or pharmacist before using salt substitutes. Do not fry foods. Cook foods using healthy methods such as baking, boiling, grilling, roasting, and broiling instead. Cook with heart-healthy oils, such as olive, canola, avocado, soybean, or sunflower oil. Meal planning  Eat a balanced diet that includes: 4 or more servings of fruits and 4 or more servings of vegetables each day. Try to fill one-half of your plate with fruits and vegetables. 6-8 servings of whole grains each day. Less than 6 oz (170 g) of lean meat, poultry, or fish each day. A 3-oz (85-g) serving of meat is about the same size as a deck of cards. One egg equals 1 oz (28 g). 2-3 servings of low-fat dairy each day. One serving is 1 cup (237 mL). 1 serving of nuts, seeds, or beans 5 times each week. 2-3 servings of heart-healthy fats. Healthy fats called omega-3 fatty acids are found in foods such  as walnuts, flaxseeds, fortified milks, and eggs. These fats are also found in cold-water fish, such as sardines, salmon, and mackerel. Limit how much you eat of: Canned or prepackaged foods. Food that is high in trans fat, such as some fried foods. Food that is high in saturated fat, such as fatty meat. Desserts and other sweets, sugary drinks, and other foods with added sugar. Full-fat dairy products. Do not salt foods before eating. Do not eat more than 4 egg yolks a week. Try to eat at least 2 vegetarian meals a week. Eat more home-cooked food and less restaurant, buffet, and fast food. Lifestyle When eating at a restaurant, ask that your food be prepared with less salt or no salt, if possible. If you drink alcohol: Limit how much you use to: 0-1 drink a day for women who are not pregnant. 0-2 drinks a day for men. Be aware of how much alcohol is in your drink. In the [place] one drink equals one 12 oz bottle of beer (355 mL), one 5 oz glass of wine (148 mL), or one 1 oz glass of hard liquor (44 mL). General information Avoid eating more than 2,300 mg of salt a day. If you have hypertension, you may need to reduce your sodium intake to 1,500 mg a day. Work with your health care provider to maintain a healthy body weight or to lose weight. Ask what an ideal weight is for you. Get at least 30 minutes of exercise that causes your heart to beat faster (  aerobic exercise) most days of the week. Activities may include walking, swimming, or biking. Work with your health care provider or dietitian to adjust your eating plan to your individual calorie needs. What foods should I eat? Fruits All fresh, dried, or frozen fruit. Canned fruit in natural juice (without added sugar). Vegetables Fresh or frozen vegetables (raw, steamed, roasted, or grilled). Low-sodium or reduced-sodium tomato and vegetable juice. Low-sodium or reduced-sodium tomato sauce and tomato paste. Low-sodium or reduced-sodium  canned vegetables. Grains Whole-grain or whole-wheat bread. Whole-grain or whole-wheat pasta. Brown rice. [place]. Bulgur. Whole-grain and low-sodium cereals. Pita bread. Low-fat, low-sodium crackers. Whole-wheat flour tortillas. Meats and other proteins Skinless chicken or [place]. Ground chicken or [place]. Pork with fat trimmed off. Fish and seafood. Egg whites. Dried beans, peas, or lentils. Unsalted nuts, nut butters, and seeds. Unsalted canned beans. Lean cuts of beef with fat trimmed off. Low-sodium, lean precooked or cured meat, such as sausages or meat loaves. Dairy Low-fat (1%) or fat-free (skim) milk. Reduced-fat, low-fat, or fat-free cheeses. Nonfat, low-sodium ricotta or cottage cheese. Low-fat or nonfat yogurt. Low-fat, low-sodium cheese. Fats and oils Soft margarine without trans fats. Vegetable oil. Reduced-fat, low-fat, or light mayonnaise and salad dressings (reduced-sodium). Canola, safflower, olive, avocado, soybean, and sunflower oils. Avocado. Seasonings and condiments Herbs. Spices. Seasoning mixes without salt. Other foods Unsalted popcorn and pretzels. Fat-free sweets. The items listed above may not be a complete list of foods and beverages you can eat. Contact a dietitian for more information. What foods should I avoid? Fruits Canned fruit in a light or heavy syrup. Fried fruit. Fruit in cream or butter sauce. Vegetables Creamed or fried vegetables. Vegetables in a cheese sauce. Regular canned vegetables (not low-sodium or reduced-sodium). Regular canned tomato sauce and paste (not low-sodium or reduced-sodium). Regular tomato and vegetable juice (not low-sodium or reduced-sodium). Angie Fava. Olives. Grains Baked goods made with fat, such as croissants, muffins, or some breads. Dry pasta or rice meal packs. Meats and other proteins Fatty cuts of meat. Ribs. Fried meat. Berniece Salines. Bologna, salami, and other precooked or cured meats, such as sausages or meat loaves. Fat  from the back of a pig (fatback). Bratwurst. Salted nuts and seeds. Canned beans with added salt. Canned or smoked fish. Whole eggs or egg yolks. Chicken or [place] with skin. Dairy Whole or 2% milk, cream, and half-and-half. Whole or full-fat cream cheese. Whole-fat or sweetened yogurt. Full-fat cheese. Nondairy creamers. Whipped toppings. Processed cheese and cheese spreads. Fats and oils Butter. Stick margarine. Lard. Shortening. Ghee. Bacon fat. Tropical oils, such as coconut, palm kernel, or palm oil. Seasonings and condiments Onion salt, garlic salt, seasoned salt, table salt, and sea salt. Worcestershire sauce. Tartar sauce. Barbecue sauce. Teriyaki sauce. Soy sauce, including reduced-sodium. Steak sauce. Canned and packaged gravies. Fish sauce. Oyster sauce. Cocktail sauce. Store-bought horseradish. Ketchup. Mustard. Meat flavorings and tenderizers. Bouillon cubes. Hot sauces. Pre-made or packaged marinades. Pre-made or packaged taco seasonings. Relishes. Regular salad dressings. Other foods Salted popcorn and pretzels. The items listed above may not be a complete list of foods and beverages you should avoid. Contact a dietitian for more information. Where to find more information National Heart, Lung, and Blood Institute: https://wilson-eaton.com/ American Heart Association: www.heart.org Academy of Nutrition and Dietetics: www.eatright.[place]: www.kidney.org Summary The DASH eating plan is a healthy eating plan that has been shown to reduce high blood pressure (hypertension). It may also reduce your risk for type 2 diabetes, heart disease, and stroke. When on the  DASH eating plan, aim to eat more fresh fruits and vegetables, whole grains, lean proteins, low-fat dairy, and heart-healthy fats. With the DASH eating plan, you should limit salt (sodium) intake to 2,300 mg a day. If you have hypertension, you may need to reduce your sodium intake to 1,500 mg a day. Work with  your health care provider or dietitian to adjust your eating plan to your individual calorie needs. This information is not intended to replace advice given to you by your health care provider. Make sure you discuss any questions you have with your health care provider. Document Revised: 04/16/2019 Document Reviewed: 04/16/2019 Elsevier Patient Education  [place].

## 2022-06-21 NOTE — Assessment & Plan Note (Signed)
Quit for 1year after smoking 1ppd x 2yr. Resume use 1year ago, now smoke 1/4ppd. Trigger: stress and around mother who also smokes in the house. She smokes in her home only.  Advised about effects on BP, lung disease, heart disease and cancer. Advised to quit.

## 2022-06-21 NOTE — Assessment & Plan Note (Addendum)
Diet: home cooked meals, small meal portion, lean protein x 16month Exercise: walking 3x/week, 30-446ms each x1year No weight loss noted with above lifestyle modifications BP Readings from Last 3 Encounters:  06/21/22 (!) 144/86  05/22/21 130/80  11/04/20 136/70    Wt Readings from Last 3 Encounters:  06/21/22 273 lb 6.4 oz (124 kg)  05/22/21 260 lb (117.9 kg)  08/18/20 279 lb 3.2 oz (126.6 kg)    Declined need for nutritionist. We discussed use of GLP injection and possible side effects. She verbalized understanding and agreed to start med. Check CMP and lipid panel F/up in 52m39month

## 2022-06-21 NOTE — Assessment & Plan Note (Signed)
With AM headache. BP Readings from Last 3 Encounters:  06/21/22 (!) 158/84  05/22/21 130/80  11/04/20 136/70    Check CMP Stop tobacco use Maintain low sodium diet Monitor BP in AM daily. Send BP reading via mychart in 1week F/up in office in 20month

## 2022-06-25 ENCOUNTER — Other Ambulatory Visit (HOSPITAL_COMMUNITY): Payer: Self-pay

## 2022-06-26 ENCOUNTER — Other Ambulatory Visit (HOSPITAL_COMMUNITY): Payer: Self-pay

## 2022-06-27 ENCOUNTER — Telehealth: Payer: Self-pay | Admitting: Nurse Practitioner

## 2022-06-27 ENCOUNTER — Other Ambulatory Visit (HOSPITAL_COMMUNITY): Payer: Self-pay

## 2022-06-27 NOTE — Telephone Encounter (Signed)
Pt is stating her insurance has informed her she is needing a PA to be able to get her Rx #: [id number]  tirzepatide (ZEPBOUND) 2.5 MG/0.5ML Pen [[id number]].   [facility] [address] [place] [address] Phone: [phone]  Fax: [phone] DEA #: [id number]  Pharmacy Comments: [id number]) 06/25/22 mdw   Pt at [phone]

## 2022-06-27 NOTE — Telephone Encounter (Signed)
Pharmacy Patient Advocate Encounter   Received notification that prior authorization for Zepbound 2.'5MG'$ /0.5ML pen-injectors is required/requested.  Per Test Claim: PA required   PA submitted on 06/27/22 to (ins) [employer] via CoverMyMeds Key [id number] Status is pending

## 2022-06-28 ENCOUNTER — Other Ambulatory Visit (HOSPITAL_COMMUNITY): Payer: Self-pay

## 2022-07-01 ENCOUNTER — Other Ambulatory Visit (HOSPITAL_COMMUNITY): Payer: Self-pay

## 2022-07-01 ENCOUNTER — Telehealth: Payer: Self-pay | Admitting: Nurse Practitioner

## 2022-07-01 MED ORDER — WEGOVY 0.5 MG/0.5ML ~~LOC~~ SOAJ
0.5000 mg | SUBCUTANEOUS | 0 refills | Status: DC
  Filled 2022-07-01 – 2022-07-04 (×3): qty 2, 28d supply, fill #0

## 2022-07-01 NOTE — Telephone Encounter (Signed)
Pt was told by her pharmacy that tirzepatide is not covered. They suggested she try another kind first.  [phone]

## 2022-07-01 NOTE — Telephone Encounter (Addendum)
Pharmacy Patient Advocate Encounter  Received notification from [employer] that the request for prior authorization for Zepbound 2.'5MG'$ /0.5ML pen-injectors has been denied due to not meeting guideline rules.       Please be advised we currently do not have a Pharmacist to review denials, therefore you will need to process appeals accordingly as needed. Thanks for your support at this time.   You may call [phone] or fax [phone], to appeal. E-Appeal is also available (key: [id number])

## 2022-07-01 NOTE — Telephone Encounter (Signed)
Called patient and made her aware of the changes

## 2022-07-02 ENCOUNTER — Other Ambulatory Visit (HOSPITAL_COMMUNITY): Payer: Self-pay

## 2022-07-03 ENCOUNTER — Telehealth: Payer: Self-pay

## 2022-07-03 ENCOUNTER — Other Ambulatory Visit (HOSPITAL_COMMUNITY): Payer: Self-pay

## 2022-07-03 NOTE — Telephone Encounter (Signed)
Pt insurance is requesting a PA on her Wegovy 0.5/0.66m  The key  [facility] 0October 17, 1982

## 2022-07-03 NOTE — Telephone Encounter (Signed)
Pharmacy Patient Advocate Encounter   Received notification that prior authorization for Wegovy 0.'5MG'$ /0.5ML auto-injectors is required/requested.   PA submitted on 07/03/22 to (ins) [employer] via CoverMyMeds Key [id number] Status is pending

## 2022-07-04 ENCOUNTER — Other Ambulatory Visit (HOSPITAL_COMMUNITY): Payer: Self-pay

## 2022-07-04 ENCOUNTER — Telehealth: Payer: Self-pay | Admitting: Nurse Practitioner

## 2022-07-04 MED ORDER — SAXENDA 18 MG/3ML ~~LOC~~ SOPN
PEN_INJECTOR | SUBCUTANEOUS | 0 refills | Status: DC
  Filled 2022-07-04: qty 9, 34d supply, fill #0
  Filled 2022-07-11: qty 9, 30d supply, fill #0

## 2022-07-04 NOTE — Telephone Encounter (Signed)
Patient Advocate Encounter  Prior Authorization for [employer] 0.'5mg'$ /0.49m has been approved.    PA# : [id number] dates: 07/03/22 through 01/22/23   Additional authorizations have been entered for the following: Wegovy 0.25 mg/0.5 mL, allowing 2 mL (four pens) per 28 days; please reference  authorization [id number]; Wegovy 1 mg/0.5 mL, allowing 2 mL (four pens) per 28 days; please reference authorization  [id number] Wegovy 1.7 mg/0.75 mL, allowing 3 mL (four pens) per 28 days; please reference  authorization [id number]; Wegovy 2.4 mg/0.75 mL, allowing 3 mL (four pens) per 28 days; please reference  authorization [id number]

## 2022-07-04 NOTE — Telephone Encounter (Signed)
Caller Name: Marlies Call back phone #: [phone]  Reason for Call: Pt stated that her pharmacy sid that the wait time for Mancel Parsons is about 3 to 4 months out and then a waiting list. She also tried to get the Zepbound And she needs authorization for why she needs this med, then her insurance would cover.

## 2022-07-04 NOTE — Telephone Encounter (Signed)
Called spoke with informed  that we have sent this into the pharmacy ,advised that they have limited supply that she will need go pick up soon, pt wanted to know if required a PA I told her I wouldn't know until it ran though her insurance. However if does we can send it though to get it approved.

## 2022-07-04 NOTE — Telephone Encounter (Signed)
Pt wants to know if there is different type weight loss medication she can use

## 2022-07-04 NOTE — Telephone Encounter (Signed)
Called and spoke with Pharmacy they have some in not much they said.

## 2022-07-04 NOTE — Telephone Encounter (Signed)
Pt  medication has switch to zepbound.

## 2022-07-05 ENCOUNTER — Other Ambulatory Visit (HOSPITAL_COMMUNITY): Payer: Self-pay

## 2022-07-05 NOTE — Telephone Encounter (Signed)
Pt stated that the pharmacy needs PA for saxenda.

## 2022-07-08 ENCOUNTER — Other Ambulatory Visit (HOSPITAL_COMMUNITY): Payer: Self-pay

## 2022-07-09 ENCOUNTER — Encounter: Payer: Self-pay | Admitting: Nurse Practitioner

## 2022-07-09 ENCOUNTER — Telehealth: Payer: Self-pay | Admitting: Nurse Practitioner

## 2022-07-09 ENCOUNTER — Other Ambulatory Visit (HOSPITAL_COMMUNITY): Payer: Self-pay

## 2022-07-09 NOTE — Telephone Encounter (Signed)
Pt stated the pharmacy and her insurance co has not received the PA for her script saxenda.

## 2022-07-09 NOTE — Telephone Encounter (Signed)
Pharmacy Patient Advocate Encounter   Received notification that prior authorization for Saxenda is required/requested.  Per Test Claim: Prior authorization required   PA submitted on 07/09/22 to (ins) [employer] via CoverMyMeds Key [place] Status is pending

## 2022-07-09 NOTE — Telephone Encounter (Signed)
Patient notified that Mancel Parsons was approved and since the Rx is on back order that it was changed to [place].   Now just need to know if prior Josem Kaufmann has been started on the Saxenda.

## 2022-07-10 ENCOUNTER — Other Ambulatory Visit: Payer: Self-pay

## 2022-07-10 DIAGNOSIS — Z1231 Encounter for screening mammogram for malignant neoplasm of breast: Secondary | ICD-10-CM

## 2022-07-11 ENCOUNTER — Other Ambulatory Visit: Payer: Self-pay

## 2022-07-11 ENCOUNTER — Other Ambulatory Visit (HOSPITAL_BASED_OUTPATIENT_CLINIC_OR_DEPARTMENT_OTHER): Payer: Self-pay

## 2022-07-11 ENCOUNTER — Other Ambulatory Visit (HOSPITAL_COMMUNITY): Payer: Self-pay

## 2022-07-11 MED ORDER — WEGOVY 0.25 MG/0.5ML ~~LOC~~ SOAJ
0.2500 mg | SUBCUTANEOUS | 0 refills | Status: DC
  Filled 2022-07-11: qty 2, 28d supply, fill #0

## 2022-07-11 NOTE — Telephone Encounter (Signed)
Patient aware.

## 2022-07-11 NOTE — Addendum Note (Signed)
Addended by: Wilfred Lacy L on: 07/11/2022 11:58 AM   Modules accepted: Orders

## 2022-07-11 NOTE — Telephone Encounter (Signed)
Patient called back and said the Saxenda was to expensive but said that she is at the pharmacy and said that they just got the [facility] in and she asked if that could please be sent in again right away before they are out again.

## 2022-07-11 NOTE — Telephone Encounter (Signed)
Patient Advocate Encounter  Prior Authorization for Saxenda 18MG/3ML pen-injectors has been approved.    PA# [id number] Effective dates: 07/10/22 through 10/30/22

## 2022-07-19 ENCOUNTER — Other Ambulatory Visit (HOSPITAL_COMMUNITY): Payer: Self-pay

## 2022-07-19 ENCOUNTER — Telehealth: Payer: Self-pay | Admitting: Nurse Practitioner

## 2022-07-19 ENCOUNTER — Other Ambulatory Visit: Payer: Self-pay

## 2022-07-19 MED ORDER — WEGOVY 0.5 MG/0.5ML ~~LOC~~ SOAJ
0.5000 mg | SUBCUTANEOUS | 1 refills | Status: DC
  Filled 2022-07-19: qty 2, 28d supply, fill #0

## 2022-07-19 NOTE — Telephone Encounter (Signed)
Patient needed second dose sent to Pharmacy.  This has been sent.

## 2022-07-19 NOTE — Telephone Encounter (Signed)
Caller Name: Noriah Call back phone #: [phone]  MEDICATION(S):  Semaglutide-Weight Management [facility]) next dose  Pt said she hasn't started [facility] as she was on Zepbound. Pharmacy advised that she fill .'25mg'$  and next increase of dose at the same time since it keeps going on back order  Pt was notified provider out of office until Monday  Has the patient contacted their pharmacy (YES/NO)? Yes  Preferred Pharmacy:  [place] Phone: [phone]  Fax: [phone]

## 2022-07-22 ENCOUNTER — Inpatient Hospital Stay: Admission: RE | Admit: 2022-07-22 | Payer: 59 | Source: Ambulatory Visit

## 2022-07-23 ENCOUNTER — Other Ambulatory Visit (HOSPITAL_COMMUNITY): Payer: Self-pay

## 2022-08-08 ENCOUNTER — Encounter: Payer: 59 | Admitting: Nurse Practitioner

## 2022-08-13 ENCOUNTER — Ambulatory Visit
Admission: RE | Admit: 2022-08-13 | Discharge: 2022-08-13 | Disposition: A | Discharge status: Home / Self Care | Payer: 59 | Source: Ambulatory Visit | Attending: Nurse Practitioner | Admitting: Nurse Practitioner

## 2022-08-13 ENCOUNTER — Ambulatory Visit (INDEPENDENT_AMBULATORY_CARE_PROVIDER_SITE_OTHER): Payer: 59 | Admitting: Nurse Practitioner

## 2022-08-13 ENCOUNTER — Other Ambulatory Visit (HOSPITAL_COMMUNITY): Payer: Self-pay

## 2022-08-13 ENCOUNTER — Encounter: Payer: Self-pay | Admitting: Nurse Practitioner

## 2022-08-13 VITALS — BP 110/80 | HR 86 | Temp 98.2°F | Resp 16 | Ht 64.0 in | Wt 269.0 lb

## 2022-08-13 DIAGNOSIS — Q810 Epidermolysis bullosa simplex: Secondary | ICD-10-CM

## 2022-08-13 DIAGNOSIS — Z1283 Encounter for screening for malignant neoplasm of skin: Secondary | ICD-10-CM

## 2022-08-13 DIAGNOSIS — R7301 Impaired fasting glucose: Secondary | ICD-10-CM

## 2022-08-13 DIAGNOSIS — R030 Elevated blood-pressure reading, without diagnosis of hypertension: Secondary | ICD-10-CM | POA: Diagnosis not present

## 2022-08-13 DIAGNOSIS — F17200 Nicotine dependence, unspecified, uncomplicated: Secondary | ICD-10-CM | POA: Diagnosis not present

## 2022-08-13 DIAGNOSIS — Z1231 Encounter for screening mammogram for malignant neoplasm of breast: Secondary | ICD-10-CM

## 2022-08-13 LAB — POCT GLYCOSYLATED HEMOGLOBIN (HGB A1C): Hemoglobin A1C: 5.7 % — AB (ref 4.0–5.6)

## 2022-08-13 MED ORDER — SEMAGLUTIDE-WEIGHT MANAGEMENT 1 MG/0.5ML ~~LOC~~ SOAJ
1.0000 mg | SUBCUTANEOUS | 0 refills | Status: DC
  Filled 2022-08-13: qty 2, 28d supply, fill #0
  Filled 2022-09-02 – 2022-09-05 (×2): qty 2, 28d supply, fill #1
  Filled 2022-09-23: qty 2, 28d supply, fill #2
  Filled ????-??-??: fill #1

## 2022-08-13 NOTE — Assessment & Plan Note (Addendum)
Lost 4lbs in last 62months with wegovy 0.5mg  Denies any adverse effects. Continue to struggle with maintain heart healthy diet and daily exercise. Wt Readings from Last 3 Encounters:  08/13/22 269 lb (122 kg)  06/21/22 273 lb 6.4 oz (124 kg)  05/22/21 260 lb (117.9 kg)    Increased wegovy to 1mg  weekly Advised about importance of lifestyle modifications: heart healthy diet, small meal portions, and daily exercise (140mins per week) Consider use of contrave if unable to afford wegovy F/up in 15months

## 2022-08-13 NOTE — Assessment & Plan Note (Signed)
Improve BP control BP Readings from Last 3 Encounters:  08/13/22 110/80  07/11/22 (!) 145/91  06/21/22 (!) 158/84    Encourage to maintain heart healthy diet, daily exercise and tobacco use cessation

## 2022-08-13 NOTE — Assessment & Plan Note (Signed)
Cutting down Now smokes 1-2cig per day Smokes when working from home and driving.  Advised about use of nicorette gum or chewing gum to help minimize tobacco use.

## 2022-08-13 NOTE — Patient Instructions (Addendum)
Maintain Heart healthy diet and daily exercise. Increase wegovy dose to 1mg  weekly hgbA1c at 5.7%: prediabetes  Steps to Quit Smoking Smoking tobacco is the leading cause of preventable death. It can affect almost every organ in the body. Smoking puts you and people around you at risk for many serious, long-lasting (chronic) diseases. Quitting smoking can be hard, but it is one of the best things that you can do for your health. It is never too late to quit. Do not give up if you cannot quit the first time. Some people need to try many times to quit. Do your best to stick to your quit plan, and talk with your doctor if you have any questions or concerns. How do I get ready to quit? Pick a date to quit. Set a date within the next 2 weeks to give you time to prepare. Write down the reasons why you are quitting. Keep this list in places where you will see it often. Tell your family, friends, and co-workers that you are quitting. Their support is important. Talk with your doctor about the choices that may help you quit. Find out if your health insurance will pay for these treatments. Know the people, places, things, and activities that make you want to smoke (triggers). Avoid them. What first steps can I take to quit smoking? Throw away all cigarettes at home, at work, and in your car. Throw away the things that you use when you smoke, such as ashtrays and lighters. Clean your car. Empty the ashtray. Clean your home, including curtains and carpets. What can I do to help me quit smoking? Talk with your doctor about taking medicines and seeing a counselor. You are more likely to succeed when you do both. If you are pregnant or breastfeeding: Talk with your doctor about counseling or other ways to quit smoking. Do not take medicine to help you quit smoking unless your doctor tells you to. Quit right away Quit smoking completely, instead of slowly cutting back on how much you smoke over a period of  time. Stopping smoking right away may be more successful than slowly quitting. Go to counseling. In-person is best if this is an option. You are more likely to quit if you go to counseling sessions regularly. Take medicine You may take medicines to help you quit. Some medicines need a prescription, and some you can buy over-the-counter. Some medicines may contain a drug called nicotine to replace the nicotine in cigarettes. Medicines may: Help you stop having the desire to smoke (cravings). Help to stop the problems that come when you stop smoking (withdrawal symptoms). Your doctor may ask you to use: Nicotine patches, gum, or lozenges. Nicotine inhalers or sprays. Non-nicotine medicine that you take by mouth. Find resources Find resources and other ways to help you quit smoking and remain smoke-free after you quit. They include: Online chats with a Social worker. Phone quitlines. Printed Furniture conservator/restorer. Support groups or group counseling. Text messaging programs. Mobile phone apps. Use apps on your mobile phone or tablet that can help you stick to your quit plan. Examples of free services include Quit Guide from the CDC and smokefree.gov  What can I do to make it easier to quit?  Talk to your family and friends. Ask them to support and encourage you. Call a phone quitline, such as 1-800-QUIT-NOW, reach out to support groups, or work with a Social worker. Ask people who smoke to not smoke around you. Avoid places that make you want to  smoke, such as: Bars. Parties. Smoke-break areas at work. Spend time with people who do not smoke. Lower the stress in your life. Stress can make you want to smoke. Try these things to lower stress: Getting regular exercise. Doing deep-breathing exercises. Doing yoga. Meditating. What benefits will I see if I quit smoking? Over time, you may have: A better sense of smell and taste. Less coughing and sore throat. A slower heart rate. Lower blood  pressure. Clearer skin. Better breathing. Fewer sick days. Summary Quitting smoking can be hard, but it is one of the best things that you can do for your health. Do not give up if you cannot quit the first time. Some people need to try many times to quit. When you decide to quit smoking, make a plan to help you succeed. Quit smoking right away, not slowly over a period of time. When you start quitting, get help and support to keep you smoke-free. This information is not intended to replace advice given to you by your health care provider. Make sure you discuss any questions you have with your health care provider. Document Revised: 05/04/2021 Document Reviewed: 05/04/2021 Elsevier Patient Education  [place].

## 2022-08-13 NOTE — Progress Notes (Signed)
Established Patient Visit  Patient: April Wolfe   DOB: Sep 30, 1980   42 y.o. Female  MRN: [id number] Visit Date: 08/13/2022  Subjective:    Chief Complaint  Patient presents with   Hypertension   Hypertension   Elevated BP without diagnosis of hypertension Improve BP control BP Readings from Last 3 Encounters:  08/13/22 110/80  07/11/22 (!) 145/91  06/21/22 (!) 158/84    Encourage to maintain heart healthy diet, daily exercise and tobacco use cessation  Tobacco use disorder Cutting down Now smokes 1-2cig per day Smokes when working from home and driving.  Advised about use of nicorette gum or chewing gum to help minimize tobacco use.  Morbid obesity ([place]) Lost 4lbs in last 11months with wegovy 0.5mg  Denies any adverse effects. Continue to struggle with maintain heart healthy diet and daily exercise. Wt Readings from Last 3 Encounters:  08/13/22 269 lb (122 kg)  06/21/22 273 lb 6.4 oz (124 kg)  05/22/21 260 lb (117.9 kg)    Increased wegovy to 1mg  weekly Advised about importance of lifestyle modifications: heart healthy diet, small meal portions, and daily exercise (115mins per week) Consider use of contrave if unable to afford wegovy F/up in 53months  Impaired fasting glucose hgbA1c at 5.7% Advised about risk of developing diabetes Repeat in 3-70months   Reviewed medical, surgical, and social history today  Medications: Outpatient Medications Prior to Visit  Medication Sig   [DISCONTINUED] Semaglutide-Weight Management (WEGOVY) 0.5 MG/0.5ML SOAJ Inject 0.5 mg into the skin once a week.   No facility-administered medications prior to visit.   Reviewed past medical and social history.   ROS per HPI above      Objective:  BP 110/80   Pulse 86   Temp 98.2 F (36.8 C) (Temporal)   Resp 16   Ht 5\' 4"  (1.626 m)   Wt 269 lb (122 kg)   LMP 11/04/2012   SpO2 96%   BMI 46.17 kg/m      Physical Exam Cardiovascular:     Rate and Rhythm:  Normal rate and regular rhythm.     Pulses: Normal pulses.     Heart sounds: Normal heart sounds.  Pulmonary:     Effort: Pulmonary effort is normal.     Breath sounds: Normal breath sounds.  Neurological:     Mental Status: She is alert and oriented to person, place, and time.     Results for orders placed or performed in visit on 08/13/22  POCT glycosylated hemoglobin (Hb A1C)  Result Value Ref Range   Hemoglobin A1C 5.7 (A) 4.0 - 5.6 %   HbA1c POC (<> result, manual entry)     HbA1c, POC (prediabetic range)     HbA1c, POC (controlled diabetic range)        Assessment & Plan:    Problem List Items Addressed This Visit       Endocrine   Impaired fasting glucose    hgbA1c at 5.7% Advised about risk of developing diabetes Repeat in 3-70months      Relevant Orders   POCT glycosylated hemoglobin (Hb A1C) (Completed)     Musculoskeletal and Integument   Epidermolysis bullosa simplex   Relevant Orders   Ambulatory referral to Dermatology     Other   Elevated BP without diagnosis of hypertension - Primary    Improve BP control BP Readings from Last 3 Encounters:  08/13/22 110/80  07/11/22 (!) 145/91  06/21/22 (!) 158/84    Encourage to maintain heart healthy diet, daily exercise and tobacco use cessation      Morbid obesity ([place])    Lost 4lbs in last 59months with wegovy 0.5mg  Denies any adverse effects. Continue to struggle with maintain heart healthy diet and daily exercise. Wt Readings from Last 3 Encounters:  08/13/22 269 lb (122 kg)  06/21/22 273 lb 6.4 oz (124 kg)  05/22/21 260 lb (117.9 kg)    Increased wegovy to 1mg  weekly Advised about importance of lifestyle modifications: heart healthy diet, small meal portions, and daily exercise (158mins per week) Consider use of contrave if unable to afford wegovy F/up in 11months      Relevant Medications   Semaglutide-Weight Management 1 MG/0.5ML SOAJ   Other Relevant Orders   POCT glycosylated hemoglobin (Hb  A1C) (Completed)   Tobacco use disorder    Cutting down Now smokes 1-2cig per day Smokes when working from home and driving.  Advised about use of nicorette gum or chewing gum to help minimize tobacco use.      Other Visit Diagnoses     Skin cancer screening       Relevant Orders   Ambulatory referral to Dermatology      Return in about 2 months (around 10/13/2022) for Weight management, hyperlipidemia (fasting).     Wilfred Lacy, NP

## 2022-08-13 NOTE — Assessment & Plan Note (Signed)
hgbA1c at 5.7% Advised about risk of developing diabetes Repeat in 3-82months

## 2022-08-14 ENCOUNTER — Other Ambulatory Visit (HOSPITAL_COMMUNITY): Payer: Self-pay

## 2022-09-02 ENCOUNTER — Other Ambulatory Visit (HOSPITAL_COMMUNITY): Payer: Self-pay

## 2022-09-05 ENCOUNTER — Other Ambulatory Visit (HOSPITAL_COMMUNITY): Payer: Self-pay

## 2022-09-23 ENCOUNTER — Encounter: Payer: 59 | Admitting: Nurse Practitioner

## 2022-09-23 ENCOUNTER — Other Ambulatory Visit (HOSPITAL_COMMUNITY): Payer: Self-pay

## 2022-10-10 ENCOUNTER — Telehealth: Payer: Self-pay | Admitting: Nurse Practitioner

## 2022-10-10 ENCOUNTER — Encounter: Payer: 59 | Admitting: Nurse Practitioner

## 2022-10-10 NOTE — Telephone Encounter (Signed)
5.16.24 no show letter sent  

## 2022-10-14 ENCOUNTER — Ambulatory Visit: Payer: 59 | Admitting: Nurse Practitioner

## 2022-10-17 NOTE — Telephone Encounter (Signed)
same day cancel, 1st instance, fee waived, letter sent   Rescheduled for 6/7

## 2022-10-18 ENCOUNTER — Telehealth: Payer: Self-pay

## 2022-10-18 ENCOUNTER — Other Ambulatory Visit (HOSPITAL_COMMUNITY): Payer: Self-pay

## 2022-10-18 DIAGNOSIS — R030 Elevated blood-pressure reading, without diagnosis of hypertension: Secondary | ICD-10-CM

## 2022-10-18 DIAGNOSIS — R7301 Impaired fasting glucose: Secondary | ICD-10-CM

## 2022-10-18 NOTE — Telephone Encounter (Signed)
A Prior Authorization was initiated for this patients Saxenda18MG /3ML pen-injectors  through CoverMyMeds.   Key: [id number]

## 2022-10-29 ENCOUNTER — Other Ambulatory Visit (HOSPITAL_COMMUNITY): Payer: Self-pay

## 2022-10-29 NOTE — Telephone Encounter (Signed)
Pharmacy Patient Advocate Encounter  Received notification from [facility] that the request for prior authorization for [facility] 18MG /3ML PEN has been denied due to  IT IS FOR WEIGHT LOSE.  NO WEIGHT LOSE MEDS ARE COVERED BY MEDIM[employer]   Please be advised we currently do not have a Pharmacist to review denials, therefore you will need to process appeals accordingly as needed. Thanks for your support at this time.

## 2022-10-30 ENCOUNTER — Other Ambulatory Visit (HOSPITAL_COMMUNITY): Payer: Self-pay

## 2022-10-30 MED ORDER — NALTREXONE-BUPROPION HCL ER 8-90 MG PO TB12
ORAL_TABLET | ORAL | 2 refills | Status: DC
  Filled 2022-10-30 (×2): qty 60, 30d supply, fill #0
  Filled 2022-11-04: qty 60, 23d supply, fill #0

## 2022-10-30 NOTE — Addendum Note (Signed)
Addended by: Michaela Corner on: 10/30/2022 09:46 AM   Modules accepted: Orders

## 2022-10-30 NOTE — Telephone Encounter (Signed)
She has been taking her Bp periodically  and noticed that it has improved. She has stopped smoking and she was sleep deprived from working two jobs.  She thinks this has helped with lowering her bp.  She will send her reading through mychart.

## 2022-10-31 ENCOUNTER — Other Ambulatory Visit (HOSPITAL_COMMUNITY): Payer: Self-pay

## 2022-11-01 ENCOUNTER — Ambulatory Visit: Payer: 59 | Admitting: Nurse Practitioner

## 2022-11-04 ENCOUNTER — Other Ambulatory Visit (HOSPITAL_COMMUNITY): Payer: Self-pay

## 2022-11-06 ENCOUNTER — Telehealth: Payer: Self-pay

## 2022-11-06 ENCOUNTER — Other Ambulatory Visit (HOSPITAL_COMMUNITY): Payer: Self-pay

## 2022-11-06 NOTE — Telephone Encounter (Signed)
Pharmacy Patient Advocate Encounter   Received notification from CoverMyMeds that prior authorization for Contrave is required/requested.   Insurance verification completed.   The patient is insured through [facility] [place] .   Per test claim:    PA submitted to [facility] via PA options: CoverMyMeds Key/confirmation #/EOC [id number] Status is pending

## 2022-11-07 ENCOUNTER — Other Ambulatory Visit (HOSPITAL_COMMUNITY): Payer: Self-pay

## 2022-11-08 ENCOUNTER — Other Ambulatory Visit (HOSPITAL_COMMUNITY): Payer: Self-pay

## 2022-11-09 NOTE — Telephone Encounter (Signed)
Patient Advocate Encounter  Prior Authorization for Contrave 8-90MG  er tablets has been approved through [employer].  Key: [id number]    Effective: 11-08-2022- to 12-08-2022   Maintenance dose effective dates: 12-09-2022 to 03-09-2023

## 2022-11-12 ENCOUNTER — Other Ambulatory Visit (HOSPITAL_COMMUNITY): Payer: Self-pay

## 2022-12-13 ENCOUNTER — Encounter: Payer: 59 | Admitting: Nurse Practitioner

## 2022-12-25 ENCOUNTER — Encounter (INDEPENDENT_AMBULATORY_CARE_PROVIDER_SITE_OTHER): Payer: Self-pay

## 2023-01-24 ENCOUNTER — Ambulatory Visit (INDEPENDENT_AMBULATORY_CARE_PROVIDER_SITE_OTHER): Payer: 59 | Admitting: Nurse Practitioner

## 2023-01-24 ENCOUNTER — Encounter: Payer: Self-pay | Admitting: Nurse Practitioner

## 2023-01-24 ENCOUNTER — Other Ambulatory Visit (HOSPITAL_COMMUNITY): Payer: Self-pay

## 2023-01-24 VITALS — BP 130/80 | HR 86 | Temp 98.3°F | Resp 16 | Ht 64.0 in | Wt 267.2 lb

## 2023-01-24 DIAGNOSIS — R030 Elevated blood-pressure reading, without diagnosis of hypertension: Secondary | ICD-10-CM

## 2023-01-24 DIAGNOSIS — R632 Polyphagia: Secondary | ICD-10-CM

## 2023-01-24 DIAGNOSIS — R7301 Impaired fasting glucose: Secondary | ICD-10-CM

## 2023-01-24 DIAGNOSIS — E7800 Pure hypercholesterolemia, unspecified: Secondary | ICD-10-CM

## 2023-01-24 DIAGNOSIS — D75839 Thrombocytosis, unspecified: Secondary | ICD-10-CM | POA: Insufficient documentation

## 2023-01-24 DIAGNOSIS — Z0001 Encounter for general adult medical examination with abnormal findings: Secondary | ICD-10-CM | POA: Diagnosis not present

## 2023-01-24 DIAGNOSIS — Z87891 Personal history of nicotine dependence: Secondary | ICD-10-CM

## 2023-01-24 DIAGNOSIS — R609 Edema, unspecified: Secondary | ICD-10-CM

## 2023-01-24 DIAGNOSIS — T753XXA Motion sickness, initial encounter: Secondary | ICD-10-CM

## 2023-01-24 LAB — LIPID PANEL
Cholesterol: 192 mg/dL (ref 0–200)
HDL: 60.7 mg/dL (ref >39.00)
LDL Cholesterol: 121 mg/dL — ABNORMAL HIGH (ref 0–99)
NonHDL: 131.04
Total CHOL/HDL Ratio: 3
Triglycerides: 52 mg/dL (ref 0.0–149.0)
VLDL: 10.4 mg/dL (ref 0.0–40.0)

## 2023-01-24 LAB — HEMOGLOBIN A1C: Hgb A1c MFr Bld: 6 % (ref 4.6–6.5)

## 2023-01-24 MED ORDER — NALTREXONE-BUPROPION HCL ER 8-90 MG PO TB12
ORAL_TABLET | ORAL | 2 refills | Status: DC
  Filled 2023-01-24: qty 120, 30d supply, fill #0

## 2023-01-24 MED ORDER — NALTREXONE-BUPROPION HCL ER 8-90 MG PO TB12
ORAL_TABLET | ORAL | 2 refills | Status: DC
  Filled 2023-01-24: qty 60, 40d supply, fill #0

## 2023-01-24 MED ORDER — PROMETHAZINE HCL 25 MG PO TABS
25.0000 mg | ORAL_TABLET | Freq: Three times a day (TID) | ORAL | 0 refills | Status: DC | PRN
  Filled 2023-01-24: qty 14, 5d supply, fill #0

## 2023-01-24 MED ORDER — SCOPOLAMINE 1 MG/3DAYS TD PT72
1.0000 | MEDICATED_PATCH | TRANSDERMAL | 0 refills | Status: DC
  Filled 2023-01-24: qty 4, 12d supply, fill #0

## 2023-01-24 NOTE — Assessment & Plan Note (Signed)
Normal BP without use of phentermine BP Readings from Last 3 Encounters:  01/24/23 130/80  08/13/22 110/80  07/11/22 (!) 145/91

## 2023-01-24 NOTE — Patient Instructions (Signed)
Go to lab maintain Heart healthy diet and daily exercise. Schedule appointment for dental cleaning and eye exam  Preventive Care 7-44 Years Old, Female Preventive care refers to lifestyle choices and visits with your health care provider that can promote health and wellness. Preventive care visits are also called wellness exams. What can I expect for my preventive care visit? Counseling Your health care provider may ask you questions about your: Medical history, including: Past medical problems. Family medical history. Pregnancy history. Current health, including: Menstrual cycle. Method of birth control. Emotional well-being. Home life and relationship well-being. Sexual activity and sexual health. Lifestyle, including: Alcohol, nicotine or tobacco, and drug use. Access to firearms. Diet, exercise, and sleep habits. Work and work Astronomer. Sunscreen use. Safety issues such as seatbelt and bike helmet use. Physical exam Your health care provider will check your: Height and weight. These may be used to calculate your BMI (body mass index). BMI is a measurement that tells if you are at a healthy weight. Waist circumference. This measures the distance around your waistline. This measurement also tells if you are at a healthy weight and may help predict your risk of certain diseases, such as type 2 diabetes and high blood pressure. Heart rate and blood pressure. Body temperature. Skin for abnormal spots. What immunizations do I need?  Vaccines are usually given at various ages, according to a schedule. Your health care provider will recommend vaccines for you based on your age, medical history, and lifestyle or other factors, such as travel or where you work. What tests do I need? Screening Your health care provider may recommend screening tests for certain conditions. This may include: Lipid and cholesterol levels. Diabetes screening. This is done by checking your blood sugar  (glucose) after you have not eaten for a while (fasting). Pelvic exam and Pap test. Hepatitis B test. Hepatitis C test. HIV (human immunodeficiency virus) test. STI (sexually transmitted infection) testing, if you are at risk. Lung cancer screening. Colorectal cancer screening. Mammogram. Talk with your health care provider about when you should start having regular mammograms. This may depend on whether you have a family history of breast cancer. BRCA-related cancer screening. This may be done if you have a family history of breast, ovarian, tubal, or peritoneal cancers. Bone density scan. This is done to screen for osteoporosis. Talk with your health care provider about your test results, treatment options, and if necessary, the need for more tests. Follow these instructions at home: Eating and drinking  Eat a diet that includes fresh fruits and vegetables, whole grains, lean protein, and low-fat dairy products. Take vitamin and mineral supplements as recommended by your health care provider. Do not drink alcohol if: Your health care provider tells you not to drink. You are pregnant, may be pregnant, or are planning to become pregnant. If you drink alcohol: Limit how much you have to 0-1 drink a day. Know how much alcohol is in your drink. In the [place] one drink equals one 12 oz bottle of beer (355 mL), one 5 oz glass of wine (148 mL), or one 1 oz glass of hard liquor (44 mL). Lifestyle Brush your teeth every morning and night with fluoride toothpaste. Floss one time each day. Exercise for at least 30 minutes 5 or more days each week. Do not use any products that contain nicotine or tobacco. These products include cigarettes, chewing tobacco, and vaping devices, such as e-cigarettes. If you need help quitting, ask your health care provider. Do not  use drugs. If you are sexually active, practice safe sex. Use a condom or other form of protection to prevent STIs. If you do not wish to  become pregnant, use a form of birth control. If you plan to become pregnant, see your health care provider for a prepregnancy visit. Take aspirin only as told by your health care provider. Make sure that you understand how much to take and what form to take. Work with your health care provider to find out whether it is safe and beneficial for you to take aspirin daily. Find healthy ways to manage stress, such as: Meditation, yoga, or listening to music. Journaling. Talking to a trusted person. Spending time with friends and family. Minimize exposure to UV radiation to reduce your risk of skin cancer. Safety Always wear your seat belt while driving or riding in a vehicle. Do not drive: If you have been drinking alcohol. Do not ride with someone who has been drinking. When you are tired or distracted. While texting. If you have been using any mind-altering substances or drugs. Wear a helmet and other protective equipment during sports activities. If you have firearms in your house, make sure you follow all gun safety procedures. Seek help if you have been physically or sexually abused. What's next? Visit your health care provider once a year for an annual wellness visit. Ask your health care provider how often you should have your eyes and teeth checked. Stay up to date on all vaccines. This information is not intended to replace advice given to you by your health care provider. Make sure you discuss any questions you have with your health care provider. Document Revised: 11/08/2020 Document Reviewed: 11/08/2020 Elsevier Patient Education  2024 [employer].

## 2023-01-24 NOTE — Assessment & Plan Note (Signed)
Secondary to sedentary lifestyle and high sodium diet. Advised to maintain DASH diet, daily exercise and use of compression stocking

## 2023-01-24 NOTE — Assessment & Plan Note (Addendum)
Unable to afford wegovy Start contrave 1tab BID 535month ago Lost 2lbs in last 535month Denies any effect of appetite, but has been able to stop tobacco use in last 2weeks Exercise: walking 2x/week Denies any adverse effects. Reports emotional eating, despite lack of hunger. Wt Readings from Last 3 Encounters:  01/24/23 267 lb 3.2 oz (121.2 kg)  08/13/22 269 lb (122 kg)  06/21/22 273 lb 6.4 oz (124 kg)    Increased contrave dose (titrate dose up to 2tabs BID) Entered referral to weight management clinic and psychology. Encourage to maintain of moderate intensity exercise weekly F/up in 3months

## 2023-01-24 NOTE — Assessment & Plan Note (Signed)
Repeat lipid panel Advised to maintain DASH diet and daily exercise

## 2023-01-24 NOTE — Progress Notes (Signed)
Complete physical exam  Patient: April Wolfe   DOB: 11/01/1980   42 y.o. Female  MRN: [id number] Visit Date: 01/24/2023  Subjective:    Chief Complaint  Patient presents with   Annual Exam    fasting   April Wolfe is a 42 y.o. female who presents today for a complete physical exam. She reports consuming a general diet.  Walking 2x/week  She generally feels well. She reports sleeping well. She does have additional problems to discuss today.  Vision:No Dental:No STD Screen:No  BP Readings from Last 3 Encounters:  01/24/23 130/80  08/13/22 110/80  07/11/22 (!) 145/91   Wt Readings from Last 3 Encounters:  01/24/23 267 lb 3.2 oz (121.2 kg)  08/13/22 269 lb (122 kg)  06/21/22 273 lb 6.4 oz (124 kg)   Most recent fall risk assessment:    08/18/2020    8:16 AM  Fall Risk   Falls in the past year? 0  Number falls in past yr: 0  Injury with Fall? 0  Risk for fall due to : No Fall Risks  Follow up Falls evaluation completed   Depression screen:Yes - No Depression  Most recent depression screenings:    01/24/2023    9:23 AM 06/21/2022    3:00 PM  PHQ 2/9 Scores  PHQ - 2 Score 0 1  PHQ- 9 Score 2 6   HPI  Former tobacco use Quit 2weeks ago with use of contrave  Morbid obesity (HCC) Unable to afford wegovy Start contrave 1tab BID 62month ago Lost 2lbs in last 62month Denies any effect of appetite, but has been able to stop tobacco use in last 2weeks Exercise: walking 2x/week Denies any adverse effects. Reports emotional eating, despite lack of hunger. Wt Readings from Last 3 Encounters:  01/24/23 267 lb 3.2 oz (121.2 kg)  08/13/22 269 lb (122 kg)  06/21/22 273 lb 6.4 oz (124 kg)    Increased contrave dose (titrate dose up to 2tabs BID) Entered referral to weight management clinic and psychology. Encourage to maintain of moderate intensity exercise weekly F/up in 3months  Elevated BP without diagnosis of hypertension Normal BP without use of  phentermine BP Readings from Last 3 Encounters:  01/24/23 130/80  08/13/22 110/80  07/11/22 (!) 145/91     Impaired fasting glucose Repeat hgbA1c  Pure hypercholesterolemia Repeat lipid panel Advised to maintain DASH diet and daily exercise  Dependent edema Secondary to sedentary lifestyle and high sodium diet. Advised to maintain DASH diet, daily exercise and use of compression stocking  Past Medical History:  Diagnosis Date   Chlamydia 04/16/12   DUB (dysfunctional uterine bleeding)    Epidermolysis bullosa simplex    Fibroids    Gonorrhea 04/16/12   No pertinent past medical history    Obesity    Past Surgical History:  Procedure Laterality Date   BILATERAL SALPINGECTOMY Bilateral 11/13/2012   Procedure: BILATERAL SALPINGECTOMY;  Surgeon: Allie Bossier, MD;  Location: [facility];  Service: Gynecology;  Laterality: Bilateral;   CYSTOSCOPY N/A 11/13/2012   Procedure: CYSTOSCOPY;  Surgeon: Allie Bossier, MD;  Location: [facility];  Service: Gynecology;  Laterality: N/A;   ROBOTIC ASSISTED TOTAL HYSTERECTOMY N/A 11/13/2012   Procedure: ROBOTIC ASSISTED TOTAL HYSTERECTOMY;  Surgeon: Allie Bossier, MD;  Location: [facility];  Service: Gynecology;  Laterality: N/A;   TONSILLECTOMY     Social History   Socioeconomic History   Marital status: Married    Spouse name: Not on file  Number of children: 1   Years of education: Not on file   Highest education level: Not on file  Occupational History   Not on file  Tobacco Use   Smoking status: Former    Current packs/day: 0.00    Average packs/day: 0.3 packs/day for 28.0 years (7.0 ttl pk-yrs)    Types: Cigarettes    Start date: 07/11/1992    Quit date: 07/11/2020    Years since quitting: 2.5   Smokeless tobacco: Never  Vaping Use   Vaping status: Never Used  Substance and Sexual Activity   Alcohol use: Yes    Alcohol/week: 1.0 standard drink of alcohol    Types: 1 Standard drinks or equivalent per week    Comment: socially   Drug use:  No   Sexual activity: Not Currently    Birth control/protection: Surgical  Other Topics Concern   Not on file  Social History Narrative   Not on file   Social Determinants of Health   Financial Resource Strain: Not on file  Food Insecurity: Not on file  Transportation Needs: Not on file  Physical Activity: Not on file  Stress: Not on file  Social Connections: Not on file  Intimate Partner Violence: Not on file   Family Status  Relation Name Status   Mother  Alive   Father  Alive   Sister  Alive   Sister  Alive   Sister  Alive   Brother  Alive   Brother  Alive   Brother  Alive   Brother  Alive   Neg Hx  (Not Specified)  No partnership data on file   Family History  Problem Relation Age of Onset   Hypertension Mother    Diabetes Mother    Hypertension Father    Hypertension Brother    Sarcoidosis Brother    Kidney disease Brother    Hypertension Brother    Diabetes Brother    Breast cancer Neg Hx    No Known Allergies  Patient Care Team: Laramie Meissner, Bonna Gains, NP as PCP - General (Internal Medicine)   Medications: Outpatient Medications Prior to Visit  Medication Sig   [DISCONTINUED] Naltrexone-buPROPion HCl ER 8-90 MG TB12 Take 1 tablet by mouth daily for 1 week, then take 1 tablet by mouth twice a day thereafter   No facility-administered medications prior to visit.    Review of Systems  Constitutional:  Negative for activity change, appetite change and unexpected weight change.  Respiratory: Negative.    Cardiovascular: Negative.   Gastrointestinal: Negative.   Endocrine: Negative for cold intolerance and heat intolerance.  Genitourinary: Negative.   Musculoskeletal: Negative.   Skin: Negative.   Neurological: Negative.   Hematological: Negative.   Psychiatric/Behavioral:  Negative for behavioral problems, decreased concentration, dysphoric mood, hallucinations, self-injury, sleep disturbance and suicidal ideas. The patient is not nervous/anxious.     Last CBC Lab Results  Component Value Date   WBC 11.4 (H) 05/22/2021   HGB 12.6 05/22/2021   HCT 39.8 05/22/2021   MCV 87.5 05/22/2021   MCH 27.7 05/22/2021   RDW 13.2 05/22/2021   PLT 528 (H) 05/22/2021   Last metabolic panel Lab Results  Component Value Date   GLUCOSE 124 (H) 06/21/2022   NA 143 06/21/2022   K 4.4 06/21/2022   CL 102 06/21/2022   CO2 31 06/21/2022   BUN 11 06/21/2022   CREATININE 0.63 06/21/2022   GFR 108.21 06/21/2022   CALCIUM 10.3 06/21/2022   PROT 7.8 06/21/2022  ALBUMIN 4.1 06/21/2022   BILITOT 0.2 06/21/2022   ALKPHOS 114 06/21/2022   AST 9 06/21/2022   ALT 13 06/21/2022   ANIONGAP 8 05/22/2021   Last lipids Lab Results  Component Value Date   CHOL 217 (H) 06/21/2022   HDL 60.00 06/21/2022   LDLCALC 138 (H) 06/21/2022   TRIG 95.0 06/21/2022   CHOLHDL 4 06/21/2022   Last hemoglobin A1c Lab Results  Component Value Date   HGBA1C 5.7 (A) 08/13/2022   Last thyroid functions Lab Results  Component Value Date   TSH 2.25 08/18/2020      Objective:  BP 130/80 (BP Location: Left Arm, Patient Position: Sitting, Cuff Size: Large)   Pulse 86   Temp 98.3 F (36.8 C) (Temporal)   Resp 16   Ht 5\' 4"  (1.626 m)   Wt 267 lb 3.2 oz (121.2 kg)   LMP 11/04/2012   SpO2 96%   BMI 45.86 kg/m     Physical Exam Vitals and nursing note reviewed.  Constitutional:      General: She is not in acute distress. HENT:     Right Ear: Tympanic membrane, ear canal and external ear normal.     Left Ear: Tympanic membrane, ear canal and external ear normal.     Nose: Nose normal.  Eyes:     Extraocular Movements: Extraocular movements intact.     Conjunctiva/sclera: Conjunctivae normal.     Pupils: Pupils are equal, round, and reactive to light.  Neck:     Thyroid: No thyroid mass, thyromegaly or thyroid tenderness.  Cardiovascular:     Rate and Rhythm: Normal rate and regular rhythm.     Pulses: Normal pulses.     Heart sounds: Normal heart  sounds.  Pulmonary:     Effort: Pulmonary effort is normal.     Breath sounds: Normal breath sounds.  Abdominal:     General: Bowel sounds are normal.     Palpations: Abdomen is soft.  Musculoskeletal:        General: Normal range of motion.     Cervical back: Normal range of motion and neck supple.     Right lower leg: Edema present.     Left lower leg: Edema present.  Lymphadenopathy:     Cervical: No cervical adenopathy.  Skin:    General: Skin is warm and dry.  Neurological:     Mental Status: She is alert and oriented to person, place, and time.     Cranial Nerves: No cranial nerve deficit.  Psychiatric:        Mood and Affect: Mood normal.        Behavior: Behavior normal.        Thought Content: Thought content normal.     No results found for any visits on 01/24/23.    Assessment & Plan:    Routine Health Maintenance and Physical Exam  Immunization History  Administered Date(s) Administered   Influenza-Unspecified 04/24/2017   PFIZER Comirnaty(Gray Top)Covid-19 Tri-Sucrose Vaccine 12/16/2019, 01/07/2020   Tdap 06/11/2013    Health Maintenance  Topic Date Due   COVID-19 Vaccine (3 - 2023-24 season) 01/25/2022   Hepatitis C Screening  08/13/2023 (Originally 06/23/1996)   HIV Screening  08/13/2023 (Originally 06/23/1993)   INFLUENZA VACCINE  08/25/2023 (Originally 12/26/2022)   DTaP/Tdap/Td (2 - Td or Tdap) 06/12/2023   HPV VACCINES  Aged Out   PAP SMEAR-Modifier  Discontinued    Discussed health benefits of physical activity, and encouraged her to engage in regular  exercise appropriate for her age and condition.  Problem List Items Addressed This Visit     Dependent edema    Secondary to sedentary lifestyle and high sodium diet. Advised to maintain DASH diet, daily exercise and use of compression stocking      Elevated BP without diagnosis of hypertension    Normal BP without use of phentermine BP Readings from Last 3 Encounters:  01/24/23 130/80   08/13/22 110/80  07/11/22 (!) 145/91         Relevant Medications   Naltrexone-buPROPion HCl ER 8-90 MG TB12   Former tobacco use    Quit 2weeks ago with use of contrave      Impaired fasting glucose    Repeat hgbA1c      Relevant Medications   Naltrexone-buPROPion HCl ER 8-90 MG TB12   Other Relevant Orders   Hemoglobin A1c   Morbid obesity (HCC)    Unable to afford wegovy Start contrave 1tab BID 39month ago Lost 2lbs in last 39month Denies any effect of appetite, but has been able to stop tobacco use in last 2weeks Exercise: walking 2x/week Denies any adverse effects. Reports emotional eating, despite lack of hunger. Wt Readings from Last 3 Encounters:  01/24/23 267 lb 3.2 oz (121.2 kg)  08/13/22 269 lb (122 kg)  06/21/22 273 lb 6.4 oz (124 kg)    Increased contrave dose (titrate dose up to 2tabs BID) Entered referral to weight management clinic and psychology. Encourage to maintain of moderate intensity exercise weekly F/up in 3months      Relevant Medications   Naltrexone-buPROPion HCl ER 8-90 MG TB12   Other Relevant Orders   Amb Ref to Medical Weight Management   Basic metabolic panel   Pure hypercholesterolemia    Repeat lipid panel Advised to maintain DASH diet and daily exercise      Relevant Orders   Lipid panel   Thrombocytosis   Relevant Orders   CBC with Differential/Platelet   Other Visit Diagnoses     Encounter for preventative adult health care exam with abnormal findings    -  Primary   Binge eating       Relevant Orders   Ambulatory referral to Psychology   Motion sickness, initial encounter       Relevant Medications   promethazine (PHENERGAN) 25 MG tablet   scopolamine (TRANSDERM-SCOP) 1 MG/3DAYS      Return in about 3 months (around 04/26/2023) for Weight management, hyperlipidemia (fasting).     Alysia Penna, NP

## 2023-01-24 NOTE — Assessment & Plan Note (Addendum)
Quit 2weeks ago with use of contrave

## 2023-01-24 NOTE — Assessment & Plan Note (Signed)
Repeat hgbA1c 

## 2023-02-27 ENCOUNTER — Telehealth: Payer: 59 | Admitting: Physician Assistant

## 2023-02-27 ENCOUNTER — Other Ambulatory Visit (HOSPITAL_COMMUNITY): Payer: Self-pay

## 2023-02-27 DIAGNOSIS — K047 Periapical abscess without sinus: Secondary | ICD-10-CM

## 2023-02-27 MED ORDER — NAPROXEN 500 MG PO TABS
500.0000 mg | ORAL_TABLET | Freq: Two times a day (BID) | ORAL | 0 refills | Status: DC
  Filled 2023-02-27: qty 15, 8d supply, fill #0

## 2023-02-27 MED ORDER — AMOXICILLIN-POT CLAVULANATE 875-125 MG PO TABS
1.0000 | ORAL_TABLET | Freq: Two times a day (BID) | ORAL | 0 refills | Status: DC
  Filled 2023-02-27 (×2): qty 14, 7d supply, fill #0

## 2023-02-27 NOTE — Progress Notes (Signed)
I have spent 5 minutes in review of e-visit questionnaire, review and updating patient chart, medical decision making and response to patient.   Mia Milan Cody Jacklynn Dehaas, PA-C    

## 2023-02-27 NOTE — Progress Notes (Signed)

## 2023-02-28 ENCOUNTER — Other Ambulatory Visit (HOSPITAL_COMMUNITY): Payer: Self-pay

## 2023-03-13 ENCOUNTER — Encounter: Payer: Self-pay | Admitting: Dermatology

## 2023-03-13 ENCOUNTER — Ambulatory Visit: Payer: 59 | Admitting: Dermatology

## 2023-03-13 VITALS — BP 173/87 | HR 89

## 2023-03-13 DIAGNOSIS — L814 Other melanin hyperpigmentation: Secondary | ICD-10-CM | POA: Diagnosis not present

## 2023-03-13 DIAGNOSIS — Q810 Epidermolysis bullosa simplex: Secondary | ICD-10-CM | POA: Diagnosis not present

## 2023-03-13 DIAGNOSIS — D225 Melanocytic nevi of trunk: Secondary | ICD-10-CM | POA: Diagnosis not present

## 2023-03-13 DIAGNOSIS — D235 Other benign neoplasm of skin of trunk: Secondary | ICD-10-CM | POA: Diagnosis not present

## 2023-03-13 DIAGNOSIS — W908XXA Exposure to other nonionizing radiation, initial encounter: Secondary | ICD-10-CM | POA: Diagnosis not present

## 2023-03-13 DIAGNOSIS — D229 Melanocytic nevi, unspecified: Secondary | ICD-10-CM

## 2023-03-13 DIAGNOSIS — Z872 Personal history of diseases of the skin and subcutaneous tissue: Secondary | ICD-10-CM

## 2023-03-13 DIAGNOSIS — Z1283 Encounter for screening for malignant neoplasm of skin: Secondary | ICD-10-CM

## 2023-03-13 DIAGNOSIS — L578 Other skin changes due to chronic exposure to nonionizing radiation: Secondary | ICD-10-CM

## 2023-03-13 DIAGNOSIS — D239 Other benign neoplasm of skin, unspecified: Secondary | ICD-10-CM

## 2023-03-13 DIAGNOSIS — L821 Other seborrheic keratosis: Secondary | ICD-10-CM

## 2023-03-13 NOTE — Patient Instructions (Addendum)
Recommended Mineral Sunscreen   Important Information   Due to recent changes in healthcare laws, you may see results of your pathology and/or laboratory studies on MyChart before the doctors have had a chance to review them. We understand that in some cases there may be results that are confusing or concerning to you. Please understand that not all results are received at the same time and often the doctors may need to interpret multiple results in order to provide you with the best plan of care or course of treatment. Therefore, we ask that you please give [place] 2 business days to thoroughly review all your results before contacting the office for clarification. Should we see a critical lab result, you will be contacted sooner.     If You Need Anything After Your Visit   If you have any questions or concerns for your doctor, please call our main line at [phone]. If no one answers, please leave a voicemail as directed and we will return your call as soon as possible. Messages left after 4 pm will be answered the following business day.    You may also send [place] a message via MyChart. We typically respond to MyChart messages within 1-2 business days.  For prescription refills, please ask your pharmacy to contact our office. Our fax number is [phone].  If you have an urgent issue when the clinic is closed that cannot wait until the next business day, you can page your doctor at the number below.     Please note that while we do our best to be available for urgent issues outside of office hours, we are not available 24/7.    If you have an urgent issue and are unable to reach [place], you may choose to seek medical care at your doctor's office, retail clinic, urgent care center, or emergency room.   If you have a medical emergency, please immediately call 911 or go to the emergency department. In the event of inclement weather, please call our main line at [phone] for an update on the  status of any delays or closures.  Dermatology Medication Tips: Please keep the boxes that topical medications come in in order to help keep track of the instructions about where and how to use these. Pharmacies typically print the medication instructions only on the boxes and not directly on the medication tubes.   If your medication is too expensive, please contact our office at [phone] or send [place] a message through MyChart.    We are unable to tell what your co-pay for medications will be in advance as this is different depending on your insurance coverage. However, we may be able to find a substitute medication at lower cost or fill out paperwork to get insurance to cover a needed medication.    If a prior authorization is required to get your medication covered by your insurance company, please allow [place] 1-2 business days to complete this process.   Drug prices often vary depending on where the prescription is filled and some pharmacies may offer cheaper prices.   The website www.goodrx.com contains coupons for medications through different pharmacies. The prices here do not account for what the cost may be with help from insurance (it may be cheaper with your insurance), but the website can give you the price if you did not use any insurance.  - You can print the associated coupon and take it with your prescription to the pharmacy.  - You may  also stop by our office during regular business hours and pick up a GoodRx coupon card.  - If you need your prescription sent electronically to a different pharmacy, notify our office through [facility] or by phone at [phone]

## 2023-03-13 NOTE — Progress Notes (Signed)
   New Patient Visit   Subjective  April Wolfe is a 42 y.o. female who presents for the following:  Total Body Skin Exam (TBSE)  Patient present today for new patient visit for TBSE.The patient denies she has spots, moles and lesions to be evaluated, some may be new or changing and the patient may have concern these could be cancer. Patient has previously been treated by a dermatologist.Patient reports she has hx of bx. Patient denies family history of skin cancers. Patient reports throughout her lifetime has had minimal sun exposure. Currently, patient reports if she has excessive sun exposure, she does apply sunscreen and/or wears protective coverings.  Patient has a history of Epidermolysis Bullosa Simplex, since birth, previously followed at [facility] with Dr. Illene Labrador. It has improved overtime.  The following portions of the chart were reviewed this encounter and updated as appropriate: medications, allergies, medical history.  Review of Systems:  No other skin or systemic complaints except as noted in HPI or Assessment and Plan.  Objective  Well appearing patient in no apparent distress; mood and affect are within normal limits.  A full examination was performed including scalp, head, eyes, ears, nose, lips, neck, chest, axillae, abdomen, back, buttocks, bilateral upper extremities, bilateral lower extremities, hands, feet, fingers, toes, fingernails, and toenails. All findings within normal limits unless otherwise noted below.   Relevant exam findings are noted in the Assessment and Plan.   Assessment & Plan   Epidermolysis Bullosa Simplex - Well controlled - Has improved with time - Was seeing Dr. Illene Labrador at [facility] as a teenager - Some nail dystrophy present on fingernails  Recent History of Blistering Sunburn with Scarring- Chest and back - Speckled scars on chest and back from relevant sunburn - Discussed that sunscreen combined with skin care regimen (other  cosmeceuticals) could have contributed to a contact dermatitis reaction in her sunexposed areas, triggered by photoexposure  LENTIGINES, SEBORRHEIC KERATOSES- Benign normal skin lesions - Benign-appearing - Call for any changes  MELANOCYTIC NEVI- on back - Tan-brown and/or pink-flesh-colored symmetric macules and papules - Benign appearing on exam today - Observation - Call clinic for new or changing moles - Recommend daily use of broad spectrum spf 30+ sunscreen to sun-exposed areas.   ACTINIC DAMAGE - Chronic condition, secondary to cumulative UV/sun exposure - diffuse scaly erythematous macules with underlying dyspigmentation - Recommend daily broad spectrum sunscreen SPF 30+ to sun-exposed areas, reapply every 2 hours as needed.  - Staying in the shade or wearing long sleeves, sun glasses (UVA+UVB protection) and wide brim hats (4-inch brim around the entire circumference of the hat) are also recommended for sun protection.  - Call for new or changing lesions.  DERMATOFIBROMA Exam: Firm pink/brown papulenodule with dimple sign on back Treatment Plan: A dermatofibroma is a benign growth possibly related to trauma, such as an insect bite, cut from shaving, or inflamed acne-type bump.  Treatment options to remove include shave or excision with resulting scar and risk of recurrence.  Since benign-appearing and not bothersome, will observe for now.   SKIN CANCER SCREENING PERFORMED TODAY    Return in about 1 year (around 03/12/2024) for TBSE.   Documentation: I have reviewed the above documentation for accuracy and completeness, and I agree with the above.  I, Nell Range, am acting as scribe for Gwenith Daily, MD.  Gwenith Daily, MD

## 2023-03-15 ENCOUNTER — Ambulatory Visit (HOSPITAL_COMMUNITY)
Admission: EM | Admit: 2023-03-15 | Discharge: 2023-03-15 | Disposition: A | Discharge status: Home / Self Care | Payer: 59 | Attending: Internal Medicine | Admitting: Internal Medicine

## 2023-03-15 ENCOUNTER — Encounter (HOSPITAL_COMMUNITY): Payer: Self-pay | Admitting: *Deleted

## 2023-03-15 DIAGNOSIS — N898 Other specified noninflammatory disorders of vagina: Secondary | ICD-10-CM | POA: Diagnosis not present

## 2023-03-15 LAB — POCT URINALYSIS DIP (MANUAL ENTRY)
Bilirubin, UA: NEGATIVE
Glucose, UA: NEGATIVE mg/dL
Ketones, POC UA: NEGATIVE mg/dL
Leukocytes, UA: NEGATIVE
Nitrite, UA: NEGATIVE
Protein Ur, POC: NEGATIVE mg/dL
Spec Grav, UA: >=1.03 — AB (ref 1.010–1.025)
Urobilinogen, UA: 0.2 U/dL
pH, UA: 6 (ref 5.0–8.0)

## 2023-03-15 LAB — HIV ANTIBODY (ROUTINE TESTING W REFLEX): HIV Screen 4th Generation wRfx: NONREACTIVE

## 2023-03-15 MED ORDER — FLUCONAZOLE 150 MG PO TABS: 150.0000 mg | ORAL_TABLET | Freq: Every day | ORAL | 0 refills | Status: DC

## 2023-03-15 NOTE — ED Provider Notes (Signed)
[facility]    CSN: [id number] Arrival date & time: 03/15/23  1002      History   Chief Complaint Chief Complaint  Patient presents with   SEXUALLY TRANSMITTED DISEASE    HPI April Wolfe is a 42 y.o. female.   42 yr old female who presents to urgent care with vaginal complaints.  She reports over the last 2 or 3 days she has noted a change in the feeling of her vagina.  It is not significantly painful and not having significant discharge however she has noted an increase moistness over the last couple of months.  She denies any significant itching.  She has not noted any hematuria, frequency, urgency, dysuria but just knows that there has been a change in the area.  She has had a new sexual partner recently but does not recall him having any symptoms.  She would like to be tested today to ensure there is no evidence of an STI.  She denies any fevers or chills otherwise.     Past Medical History:  Diagnosis Date   Chlamydia 04/16/12   DUB (dysfunctional uterine bleeding)    Epidermolysis bullosa simplex    Fibroids    Gonorrhea 04/16/12   No pertinent past medical history    Obesity     Patient Active Problem List   Diagnosis Date Noted   Pure hypercholesterolemia 01/24/2023   Dependent edema 01/24/2023   Thrombocytosis 01/24/2023   Impaired fasting glucose 08/13/2022   Former tobacco use 06/21/2022   Morbid obesity (HCC) 06/21/2022   Elevated BP without diagnosis of hypertension 06/21/2022   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   Rash and nonspecific skin eruption 10/05/2016   Anemia 11/13/2012   Leiomyoma of uterus, unspecified 10/03/2011    Past Surgical History:  Procedure Laterality Date   BILATERAL SALPINGECTOMY Bilateral 11/13/2012   Procedure: BILATERAL SALPINGECTOMY;  Surgeon: Allie Bossier, MD;  Location: [facility];  Service: Gynecology;  Laterality: Bilateral;   CYSTOSCOPY N/A 11/13/2012   Procedure: CYSTOSCOPY;  Surgeon: Allie Bossier, MD;   Location: [facility];  Service: Gynecology;  Laterality: N/A;   ROBOTIC ASSISTED TOTAL HYSTERECTOMY N/A 11/13/2012   Procedure: ROBOTIC ASSISTED TOTAL HYSTERECTOMY;  Surgeon: Allie Bossier, MD;  Location: [facility];  Service: Gynecology;  Laterality: N/A;   TONSILLECTOMY      OB History     Gravida  3   Para  2   Term  1   Preterm  1   AB  1   Living  2      SAB  1   IAB  0   Ectopic  0   Multiple  0   Live Births               Home Medications    Prior to Admission medications   Medication Sig Start Date End Date Taking? Authorizing Provider  amoxicillin-clavulanate (AUGMENTIN) 875-125 MG tablet Take 1 tablet by mouth 2 (two) times daily. 02/27/23   Waldon Merl, PA-C  Naltrexone-buPROPion HCl ER 8-90 MG TB12 2tabs in Am and 1tab in PM x 1week, then 2tabs in AM and PM continuously. 01/24/23   Nche, Bonna Gains, NP  naproxen (NAPROSYN) 500 MG tablet Take 1 tablet (500 mg total) by mouth 2 (two) times daily with a meal. 02/27/23   Waldon Merl, PA-C  promethazine (PHENERGAN) 25 MG tablet Take 1 tablet (25 mg total) by mouth every 8 (eight) hours as needed for nausea  or vomiting. 01/24/23   Nche, Bonna Gains, NP  scopolamine (TRANSDERM-SCOP) 1 MG/3DAYS Place 1 patch (1.5 mg total) onto the skin every 3 (three) days. 01/24/23   Nche, Bonna Gains, NP    Family History Family History  Problem Relation Age of Onset   Hypertension Mother    Diabetes Mother    Hypertension Father    Hypertension Brother    Sarcoidosis Brother    Kidney disease Brother    Hypertension Brother    Diabetes Brother    Breast cancer Neg Hx     Social History Social History   Tobacco Use   Smoking status: Every Day    Current packs/day: 0.00    Average packs/day: 0.3 packs/day for 28.0 years (7.0 ttl pk-yrs)    Types: Cigarettes    Start date: 07/11/1992    Last attempt to quit: 07/11/2020    Years since quitting: 2.6    Passive exposure: Never   Smokeless tobacco: Never   Vaping Use   Vaping status: Never Used  Substance Use Topics   Alcohol use: Yes    Alcohol/week: 1.0 standard drink of alcohol    Types: 1 Standard drinks or equivalent per week    Comment: socially   Drug use: No     Allergies   Patient has no known allergies.   Review of Systems Review of Systems  Constitutional:  Negative for chills and fever.  HENT:  Negative for ear pain and sore throat.   Eyes:  Negative for pain and visual disturbance.  Respiratory:  Negative for cough and shortness of breath.   Cardiovascular:  Negative for chest pain and palpitations.  Gastrointestinal:  Negative for abdominal pain and vomiting.  Genitourinary:  Positive for vaginal discharge (Increased moistness). Negative for dysuria and hematuria.  Musculoskeletal:  Negative for arthralgias and back pain.  Skin:  Negative for color change and rash.  Neurological:  Negative for seizures and syncope.  All other systems reviewed and are negative.    Physical Exam Triage Vital Signs ED Triage Vitals  Encounter Vitals Group     BP 03/15/23 1015 (!) 145/99     Systolic BP Percentile --      Diastolic BP Percentile --      Pulse Rate 03/15/23 1015 79     Resp 03/15/23 1015 18     Temp 03/15/23 1015 98.6 F (37 C)     Temp Source 03/15/23 1015 Oral     SpO2 03/15/23 1015 97 %     Weight --      Height --      Head Circumference --      Peak Flow --      Pain Score 03/15/23 1013 0     Pain Loc --      Pain Education --      Exclude from Growth Chart --    No data found.  Updated Vital Signs BP (!) 145/99 (BP Location: Left Arm)   Pulse 79   Temp 98.6 F (37 C) (Oral)   Resp 18   LMP 11/04/2012   SpO2 97%   Visual Acuity Right Eye Distance:   Left Eye Distance:   Bilateral Distance:    Right Eye Near:   Left Eye Near:    Bilateral Near:     Physical Exam Vitals and nursing note reviewed.  Constitutional:      General: She is not in acute distress.    Appearance: She is  well-developed.  HENT:     Head: Normocephalic and atraumatic.  Eyes:     Conjunctiva/sclera: Conjunctivae normal.  Cardiovascular:     Rate and Rhythm: Normal rate and regular rhythm.     Heart sounds: No murmur heard. Pulmonary:     Effort: Pulmonary effort is normal. No respiratory distress.     Breath sounds: Normal breath sounds.  Abdominal:     Palpations: Abdomen is soft.     Tenderness: There is no abdominal tenderness.  Musculoskeletal:        General: No swelling.     Cervical back: Neck supple.  Skin:    General: Skin is warm and dry.     Capillary Refill: Capillary refill takes less than 2 seconds.  Neurological:     Mental Status: She is alert.  Psychiatric:        Mood and Affect: Mood normal.      UC Treatments / Results  Labs (all labs ordered are listed, but only abnormal results are displayed) Labs Reviewed - No data to display  EKG   Radiology No results found.  Procedures Procedures (including critical care time)  Medications Ordered in UC Medications - No data to display  Initial Impression / Assessment and Plan / UC Course  I have reviewed the triage vital signs and the nursing notes.  Pertinent labs & imaging results that were available during my care of the patient were reviewed by me and considered in my medical decision making (see chart for details).     Vaginal discharge   Symptoms most likely secondary to a vaginal yeast infection especially given that the patient just finished a course of Augmentin.  Will treat with Diflucan 1 tablet by mouth today and repeat in 3 days if symptoms persist.  STI testing and HIV testing is sent off and we will contact the patient if anything is positive and requiring further treatment.  The urinalysis was minimally impressive with no leukocytes or bacteria with a trace amount of blood but likely this is vaginal in nature. Return to urgent care or PCP if symptoms worsen or fail to resolve.    Final  Clinical Impressions(s) / UC Diagnoses   Final diagnoses:  None   Discharge Instructions   None    ED Prescriptions   None    PDMP not reviewed this encounter.   Landis Martins, [place] 03/15/23 1058

## 2023-03-15 NOTE — Discharge Instructions (Addendum)
Testing done today and will be available in 24 hours. We will contact you if any thing needs further treatment.  Urinalysis did show trace amount of blood but no bacteria or leukocytes.  Will await other testing for treatment. Will start Diflucan 1 tablet today for possible yeast infection.   Return to urgent care or PCP if symptoms worsen or fail to resolve.

## 2023-03-15 NOTE — ED Triage Notes (Signed)
Pt states she would like STI testing she isn't sure if she has a yeast infection or a STI. She states her vagina just feels different X 2-3 days

## 2023-03-17 LAB — CERVICOVAGINAL ANCILLARY ONLY
Bacterial Vaginitis (gardnerella): POSITIVE — AB
Chlamydia: NEGATIVE
Comment: NEGATIVE
Comment: NEGATIVE
Comment: NEGATIVE
Comment: NORMAL
Neisseria Gonorrhea: NEGATIVE
Trichomonas: POSITIVE — AB

## 2023-03-18 ENCOUNTER — Telehealth: Payer: Self-pay

## 2023-03-18 ENCOUNTER — Other Ambulatory Visit (HOSPITAL_COMMUNITY): Payer: Self-pay

## 2023-03-18 MED ORDER — METRONIDAZOLE 500 MG PO TABS
500.0000 mg | ORAL_TABLET | Freq: Two times a day (BID) | ORAL | 0 refills | Status: AC
  Filled 2023-03-18: qty 14, 7d supply, fill #0

## 2023-03-18 NOTE — Telephone Encounter (Signed)
Contacted patient by phone.  Verified identity using two identifiers.  Provided positive result.  Reviewed safe sex practices, notifying partners, and refraining from sexual activities for 7 days from time of treatment.  Patient verified understanding, all questions answered.     Per protocol, pt requires tx with metronidazole. Reviewed with patient, verified pharmacy, prescription sent.

## 2023-04-02 ENCOUNTER — Encounter (INDEPENDENT_AMBULATORY_CARE_PROVIDER_SITE_OTHER): Payer: Self-pay | Admitting: Family Medicine

## 2023-04-02 NOTE — Progress Notes (Incomplete)
Carlye Grippe, DO, ABFM, ABOM Bariatric physician [address] [place], [place], [place] [address] Office: [phone]  /  Fax: [phone]     Initial Evaluation:  April Wolfe was seen in clinic today to evaluate for obesity. She is interested in losing weight to improve overall health and reduce the risk of weight related complications. She presents today to review program treatment options, initial physical assessment, and evaluation.      She was referred by: {emreferby:28303}  When asked how has your weight affected you? She states: {EMWeightAffected:28305}  Contributing factors to her weight change: {EMcontributingfactors:28307}  Some associated conditions: {EMSomeConditions:28306}  Current nutrition plan: {EMNutritionplan:28309::"None"}  Current level of physical activity: {EMcurrentPA:28310::"None"}  Current or previous pharmacotherapy: {EM previousRx:28311}  Response to medication: {EMResponsetomedication:28312}   @MEDCOMM @    Past Medical History:  Diagnosis Date   Chlamydia 04/16/12   DUB (dysfunctional uterine bleeding)    Epidermolysis bullosa simplex    Fibroids    Gonorrhea 04/16/12   No pertinent past medical history    Obesity      Objective:  LMP 11/04/2012  She was weighed on the bioimpedance scale: There is no height or weight on file to calculate BMI.  Visceral Fat %:***, Body Fat %:***  No data recorded No data recorded ***  No data recorded No data recorded No data recorded No data recorded   General: Well Developed, well nourished, and in no acute distress.  HEENT: Normocephalic, atraumatic Skin: Warm and dry, good turgor Chest:  Normal excursion, shape, no gross ABN Respiratory: no conversational dyspnea; speaking in full sentences NeuroM-Sk:  normal gross ROM * 4 extremities  Psych: A and O *3, insight adequate, mood- full    Assessment and Plan:  @diagmed @    ***  - We discussed obesity as a disease and the importance  of a more detailed evaluation of all the factors contributing to the disease.  - We reviewed weight, biometrics, associated medical conditions and contributing factors with patient. she would benefit from weight loss therapy via a modified calorie, low-carb, high-protein nutritional plan tailored to their REE (resting energy expenditure) which will be determined by indirect calorimetry at their next office visit.    Multifaceted obesity treatment plan: Action Plan: - she was weighed on the bioimpedance scale and results were discussed and documented in the synopsis.   Liliane Bade Riera will complete provided nutritional and psychosocial assessment questionnaire before the next appointment.  - she will be scheduled for indirect calorimetry to determine resting energy expenditure in a fasting state.  This will allow [place] to create a reduced calorie, high-protein meal plan to promote loss of fat mass while preserving muscle mass.  - We will also assess for cardiometabolic risk and nutritional derangements via an ECG and fasting serologies at her next appointment.  - she was encouraged to work on amassing support from family and friends to begin their weight loss journey.   - Work on eliminating or reducing the presence of highly processed, poorly nutritious, calorie-dense foods in the home.  Obesity Education Performed Today: - Patient was counseled on nutritional approaches to weight loss and benefits of reducing processed foods and consuming plant-based foods and high quality protein as part of nutritional weight management program.   - We discussed the importance of long term lifestyle changes which include nutrition, exercise and behavioral modifications as well as the importance of customizing this to her specific health and social needs.   - We discussed the benefits of reaching  a healthier weight to alleviate the symptoms of existing conditions and reduce the risks of the biomechanical,  metabolic and psychological effects of obesity.  - Was counseled on the health benefits of losing 5%-10% of total body weight.  - Was counseled on our cognitive behavorial therapy program, lead by our bariatric psychologist, who focuses on emotional eating and creating positive behavorial change.  - Was counseled on bariatric pharmacotherapy and how this may be used as an adjunct in their weight management     @FIRSTNAME @ appears to be in the action stage of change and states they are ready to start intensive lifestyle modifications and behavioral modifications.  It was recommended that she follow up in the next 1-2 weeks to review the above steps, and to continue with treatment of their chronic disease state of obesity   Attestations:  - Reviewed by clinician on day of visit: allergies, medications, problem list, medical history, surgical history, family history, social history, and previous encounter notes pertinent to obesity diagnosis.  *** 40 minutes was spent today on this visit including the above counseling, pre-visit chart review, and post-visit documentation.  Over 50% of this time was spent in direct, face-to-face counseling and coordination of care  I, Special Puri ***, acting as a medical scribe for Thomasene Lot, DO., have compiled all relevant documentation for today's office visit on behalf of Thomasene Lot, DO, while in the presence of [employer], DO.  I have reviewed the above documentation for accuracy and completeness, and I agree with the above. Carlye Grippe, D.O.  The 21st Century Cures Act was signed into law in 2016 which includes the topic of electronic health records.  This provides immediate access to information in MyChart.  This includes consultation notes, operative notes, office notes, lab results and pathology reports.  If you have any questions about what you read please let us know at your next visit so we can discuss your concerns and take  corrective action if need be.  We are right here with you!

## 2023-06-28 ENCOUNTER — Other Ambulatory Visit (HOSPITAL_COMMUNITY): Payer: Self-pay

## 2023-06-28 ENCOUNTER — Ambulatory Visit
Admission: RE | Admit: 2023-06-28 | Discharge: 2023-06-28 | Discharge status: Home / Self Care | Payer: 59 | Source: Ambulatory Visit | Attending: Internal Medicine

## 2023-06-28 VITALS — BP 153/95 | HR 103 | Temp 99.2°F | Resp 17

## 2023-06-28 DIAGNOSIS — J111 Influenza due to unidentified influenza virus with other respiratory manifestations: Secondary | ICD-10-CM | POA: Diagnosis not present

## 2023-06-28 DIAGNOSIS — J209 Acute bronchitis, unspecified: Secondary | ICD-10-CM

## 2023-06-28 DIAGNOSIS — Z20828 Contact with and (suspected) exposure to other viral communicable diseases: Secondary | ICD-10-CM

## 2023-06-28 LAB — POCT INFLUENZA A/B
Influenza A, POC: NEGATIVE
Influenza B, POC: NEGATIVE

## 2023-06-28 MED ORDER — PROMETHAZINE-DM 6.25-15 MG/5ML PO SYRP
5.0000 mL | ORAL_SOLUTION | Freq: Every evening | ORAL | 0 refills | Status: DC | PRN
  Filled 2023-06-28: qty 118, 23d supply, fill #0

## 2023-06-28 MED ORDER — OSELTAMIVIR PHOSPHATE 75 MG PO CAPS
75.0000 mg | ORAL_CAPSULE | Freq: Two times a day (BID) | ORAL | 0 refills | Status: DC
  Filled 2023-06-28: qty 10, 5d supply, fill #0

## 2023-06-28 MED ORDER — PREDNISONE 20 MG PO TABS
40.0000 mg | ORAL_TABLET | Freq: Every day | ORAL | 0 refills | Status: AC
  Filled 2023-06-28: qty 10, 5d supply, fill #0

## 2023-06-28 MED ORDER — ACETAMINOPHEN 325 MG PO TABS
975.0000 mg | ORAL_TABLET | Freq: Once | ORAL | Status: AC
  Administered 2023-06-28: 975 mg via ORAL

## 2023-06-28 MED ORDER — ONDANSETRON 4 MG PO TBDP
4.0000 mg | ORAL_TABLET | Freq: Three times a day (TID) | ORAL | 0 refills | Status: DC | PRN
  Filled 2023-06-28: qty 20, 7d supply, fill #0

## 2023-06-28 MED ORDER — ALBUTEROL SULFATE (2.5 MG/3ML) 0.083% IN NEBU
2.5000 mg | INHALATION_SOLUTION | Freq: Once | RESPIRATORY_TRACT | Status: AC
  Administered 2023-06-28: 2.5 mg via RESPIRATORY_TRACT

## 2023-06-28 MED ORDER — ALBUTEROL SULFATE HFA 108 (90 BASE) MCG/ACT IN AERS
1.0000 | INHALATION_SPRAY | Freq: Four times a day (QID) | RESPIRATORY_TRACT | 0 refills | Status: DC | PRN
  Filled 2023-06-28: qty 6.7, 25d supply, fill #0

## 2023-06-28 NOTE — Discharge Instructions (Addendum)
You have the flu. Take Tamiflu every 12 hours for the next 5 days to improve symptoms and stop the virus from replicating in your body. Ibuprofen/tylenol as needed for fevers and body aches. Mucinex as needed for nasal congestion. Zofran as needed for nausea/vomiting. Promethazine DM at bedtime as needed for cough. Prednisone burst for 5 days starting today. Take prednisone 40mg  once daily for 5 days. Albuterol inhaler every 4-6 hours as needed for cough and wheezing.   If you develop any new or worsening symptoms or if your symptoms do not start to improve, please return here or follow-up with your primary care provider. If your symptoms are severe, please go to the emergency room.

## 2023-06-28 NOTE — ED Provider Notes (Signed)
[name]    CSN: [id number] Arrival date & time: 06/28/23  4098      History   Chief Complaint Chief Complaint  Patient presents with   Cough    Chills, body pain , shortness of breath, sore throat - Entered by patient    HPI April Wolfe is a 43 y.o. female.   Patient presents to urgent care for evaluation of cough, chills, body aches, sore throat, and shortness of breath with cough that started 2 days ago on Thursday, January 30th 2025.  She reports multiple recent sick contacts at home with positive influenza testing.  Cough is dry and mostly nonproductive.  She is a current everyday cigarette smoker but states she has not been able to smoke in the last few days due to shortness of breath.  Denies history of asthma, COPD, etc.  She has had a little bit of nausea without vomiting.  She has had a few episodes of nonbloody/nonmucous diarrhea over the last 12 to 24 hours.  Denies recent antibiotic or steroid use.  She does not have access to an inhaler at home.  Denies chest pain, palpitations, abdominal pain, rash, and dizziness.  Taking over-the-counter medications without much relief of symptoms.   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   Rash and nonspecific skin eruption 10/05/2016   Anemia 11/13/2012   Leiomyoma of uterus, unspecified 10/03/2011    Past Surgical History:  Procedure Laterality Date   BILATERAL  SALPINGECTOMY Bilateral 11/13/2012   Procedure: BILATERAL SALPINGECTOMY;  Surgeon: Allie Bossier, MD;  Location: [facility];  Service: Gynecology;  Laterality: Bilateral;   CYSTOSCOPY N/A 11/13/2012   Procedure: CYSTOSCOPY;  Surgeon: Allie Bossier, MD;  Location: [facility];  Service: Gynecology;  Laterality: N/A;   ROBOTIC ASSISTED TOTAL HYSTERECTOMY N/A 11/13/2012   Procedure: ROBOTIC ASSISTED TOTAL HYSTERECTOMY;  Surgeon: Allie Bossier, MD;  Location: [facility];  Service: Gynecology;  Laterality: N/A;   TONSILLECTOMY      OB History     Gravida  3   Para  2   Term  1   Preterm  1   AB  1   Living  2      SAB  1   IAB  0   Ectopic  0   Multiple  0   Live Births               Home Medications    Prior to Admission medications   Medication Sig Start Date End Date Taking? Authorizing Provider  albuterol (VENTOLIN HFA) 108 (90 Base) MCG/ACT inhaler Inhale 1-2 puffs into the lungs every 6 (six) hours as needed for wheezing or shortness of breath. 06/28/23  Yes Carlisle Beers, FNP  ondansetron (ZOFRAN-ODT) 4 MG disintegrating tablet Take 1 tablet (4 mg total) by mouth every 8 (eight) hours as needed for nausea or vomiting. 06/28/23  Yes Carlisle Beers,  FNP  oseltamivir (TAMIFLU) 75 MG capsule Take 1 capsule (75 mg total) by mouth every 12 (twelve) hours. 06/28/23  Yes Carlisle Beers, FNP  predniSONE (DELTASONE) 20 MG tablet Take 2 tablets (40 mg total) by mouth daily with breakfast for 5 days. 06/28/23 07/03/23 Yes Carlisle Beers, FNP  promethazine-dextromethorphan (PROMETHAZINE-DM) 6.25-15 MG/5ML syrup Take 5 mLs by mouth at bedtime as needed for cough. 06/28/23  Yes Carlisle Beers, FNP  amoxicillin-clavulanate (AUGMENTIN) 875-125 MG tablet Take 1 tablet by mouth 2 (two) times daily. Patient not taking: Reported on 06/28/2023 02/27/23   Waldon Merl, PA-C  fluconazole (DIFLUCAN) 150 MG tablet Take 1 tablet (150 mg total) by mouth daily. Take 1 tablet today,  repeat in 3 days if symptoms persist Patient not taking: Reported on 06/28/2023 03/15/23   Landis Martins, PA-C  Naltrexone-buPROPion HCl ER 8-90 MG TB12 2tabs in Am and 1tab in PM x 1week, then 2tabs in AM and PM continuously. Patient not taking: Reported on 06/28/2023 01/24/23   Nche, Bonna Gains, NP  naproxen (NAPROSYN) 500 MG tablet Take 1 tablet (500 mg total) by mouth 2 (two) times daily with a meal. 02/27/23   Waldon Merl, PA-C  promethazine (PHENERGAN) 25 MG tablet Take 1 tablet (25 mg total) by mouth every 8 (eight) hours as needed for nausea or vomiting. Patient not taking: Reported on 06/28/2023 01/24/23   Nche, Bonna Gains, NP  scopolamine (TRANSDERM-SCOP) 1 MG/3DAYS Place 1 patch (1.5 mg total) onto the skin every 3 (three) days. 01/24/23   Nche, Bonna Gains, NP    Family History Family History  Problem Relation Age of Onset   Hypertension Mother    Diabetes Mother    Hypertension Father    Hypertension Brother    Sarcoidosis Brother    Kidney disease Brother    Hypertension Brother    Diabetes Brother    Breast cancer Neg Hx     Social History Social History   Tobacco Use   Smoking status: Every Day    Current packs/day: 0.00    Average packs/day: 0.3 packs/day for 28.0 years (7.0 ttl pk-yrs)    Types: Cigarettes    Start date: 07/11/1992    Last attempt to quit: 07/11/2020    Years since quitting: 2.9    Passive exposure: Never   Smokeless tobacco: Never  Vaping Use   Vaping status: Never Used  Substance Use Topics   Alcohol use: Yes    Alcohol/week: 1.0 standard drink of alcohol    Types: 1 Standard drinks or equivalent per week    Comment: socially   Drug use: No     Allergies   Patient has no known allergies.   Review of Systems Review of Systems  Respiratory:  Positive for cough.   Per HPI   Physical Exam Triage Vital Signs ED Triage Vitals  Encounter Vitals Group     BP 06/28/23 1011 (!) 153/95     Systolic BP Percentile --       Diastolic BP Percentile --      Pulse Rate 06/28/23 1011 (!) 103     Resp 06/28/23 1011 17     Temp 06/28/23 1011 99.2 F (37.3 C)     Temp Source 06/28/23 1011 Oral     SpO2 06/28/23 1011 93 %     Weight --      Height --      Head Circumference --      Peak Flow --  Pain Score 06/28/23 1014 9     Pain Loc --      Pain Education --      Exclude from Growth Chart --    No data found.  Updated Vital Signs BP (!) 153/95 (BP Location: Right Arm)   Pulse (!) 103   Temp 99.2 F (37.3 C) (Oral)   Resp 17   LMP 11/04/2012   SpO2 93%   Visual Acuity Right Eye Distance:   Left Eye Distance:   Bilateral Distance:    Right Eye Near:   Left Eye Near:    Bilateral Near:     Physical Exam Vitals and nursing note reviewed.  Constitutional:      Appearance: She is not ill-appearing or toxic-appearing.  HENT:     Head: Normocephalic and atraumatic.     Right Ear: Hearing, tympanic membrane, ear canal and external ear normal.     Left Ear: Hearing, tympanic membrane, ear canal and external ear normal.     Nose: Nose normal.     Mouth/Throat:     Lips: Pink.     Mouth: Mucous membranes are moist. No injury or oral lesions.     Dentition: Normal dentition.     Tongue: No lesions.     Pharynx: Oropharynx is clear. Uvula midline. No pharyngeal swelling, oropharyngeal exudate, posterior oropharyngeal erythema, uvula swelling or postnasal drip.     Tonsils: No tonsillar exudate.  Eyes:     General: Lids are normal. Vision grossly intact. Gaze aligned appropriately.     Extraocular Movements: Extraocular movements intact.     Conjunctiva/sclera: Conjunctivae normal.  Neck:     Trachea: Trachea and phonation normal.  Cardiovascular:     Rate and Rhythm: Normal rate and regular rhythm.     Heart sounds: Normal heart sounds, S1 normal and S2 normal.  Pulmonary:     Effort: Pulmonary effort is normal. No respiratory distress.     Breath sounds: Normal breath sounds and air  entry. No wheezing, rhonchi or rales.     Comments: Speaks in full sentences without difficulty. Course and diminished breath sounds throughout.  Chest:     Chest wall: No tenderness.  Musculoskeletal:     Cervical back: Neck supple.  Lymphadenopathy:     Cervical: No cervical adenopathy.  Skin:    General: Skin is warm and dry.     Capillary Refill: Capillary refill takes less than 2 seconds.     Findings: No rash.  Neurological:     General: No focal deficit present.     Mental Status: She is alert and oriented to person, place, and time. Mental status is at baseline.     Cranial Nerves: No dysarthria or facial asymmetry.  Psychiatric:        Mood and Affect: Mood normal.        Speech: Speech normal.        Behavior: Behavior normal.        Thought Content: Thought content normal.        Judgment: Judgment normal.      UC Treatments / Results  Labs (all labs ordered are listed, but only abnormal results are displayed) Labs Reviewed  POCT INFLUENZA A/B    EKG   Radiology No results found.  Procedures Procedures (including critical care time)  Medications Ordered in UC Medications  albuterol (PROVENTIL) (2.5 MG/3ML) 0.083% nebulizer solution 2.5 mg (2.5 mg Nebulization Given 06/28/23 1056)  acetaminophen (TYLENOL) tablet 975 mg (975  mg Oral Given 06/28/23 1055)    Initial Impression / Assessment and Plan / UC Course  I have reviewed the triage vital signs and the nursing notes.  Pertinent labs & imaging results that were available during my care of the patient were reviewed by me and considered in my medical decision making (see chart for details).   1. Influenza-like illness, exposure to flu, acute bronchitis Presentation consistent with acute viral bronchitis likely due to influenza given known exposure and symptoms.  Flu A and B testing negative.  Will treat with Tamiflu, steroid, bronchodilator, cough suppressants for symptomatic relief, and expectorants  (mucinex) as needed- see AVS.   Interventions in clinic: Albuterol breathing treatment administered with improvement in lung sounds on reassessment. Patient reports significant improvement in shortness of breath after albuterol.   Patient non-toxic in appearance, vital signs hemodynamically stable, no new oxygen requirement, therefore deferred imaging of the chest. Low suspicion for acute cardiopulmonary abnormality based on history and PE.  Counseled patient on potential for adverse effects with medications prescribed/recommended today, strict ER and return-to-clinic precautions discussed, patient verbalized understanding.    Final Clinical Impressions(s) / UC Diagnoses   Final diagnoses:  Influenza-like illness  Exposure to the flu  Acute bronchitis, unspecified organism     Discharge Instructions      You have the flu. Take Tamiflu every 12 hours for the next 5 days to improve symptoms and stop the virus from replicating in your body. Ibuprofen/tylenol as needed for fevers and body aches. Mucinex as needed for nasal congestion. Zofran as needed for nausea/vomiting. Promethazine DM at bedtime as needed for cough. Prednisone burst for 5 days starting today. Take prednisone 40mg  once daily for 5 days. Albuterol inhaler every 4-6 hours as needed for cough and wheezing.   If you develop any new or worsening symptoms or if your symptoms do not start to improve, please return here or follow-up with your primary care provider. If your symptoms are severe, please go to the emergency room.    ED Prescriptions     Medication Sig Dispense Auth. Provider   oseltamivir (TAMIFLU) 75 MG capsule Take 1 capsule (75 mg total) by mouth every 12 (twelve) hours. 10 capsule Carlisle Beers, FNP   ondansetron (ZOFRAN-ODT) 4 MG disintegrating tablet Take 1 tablet (4 mg total) by mouth every 8 (eight) hours as needed for nausea or vomiting. 20 tablet Carlisle Beers, FNP    promethazine-dextromethorphan (PROMETHAZINE-DM) 6.25-15 MG/5ML syrup Take 5 mLs by mouth at bedtime as needed for cough. 118 mL Reita May M, FNP   predniSONE (DELTASONE) 20 MG tablet Take 2 tablets (40 mg total) by mouth daily with breakfast for 5 days. 10 tablet Carlisle Beers, FNP   albuterol (VENTOLIN HFA) 108 (90 Base) MCG/ACT inhaler Inhale 1-2 puffs into the lungs every 6 (six) hours as needed for wheezing or shortness of breath. 8 g Carlisle Beers, FNP      PDMP not reviewed this encounter.   Carlisle Beers, [place] 06/28/23 [id number]

## 2023-06-28 NOTE — ED Triage Notes (Signed)
Pt c/o cough, sore throat, body aches, chills since Thursday. She took tylenol last night. States her family has flu

## 2023-08-26 ENCOUNTER — Other Ambulatory Visit: Payer: Self-pay

## 2023-08-26 ENCOUNTER — Emergency Department (HOSPITAL_BASED_OUTPATIENT_CLINIC_OR_DEPARTMENT_OTHER)
Admission: EM | Admit: 2023-08-26 | Discharge: 2023-08-26 | Disposition: A | Discharge status: Home / Self Care | Attending: Emergency Medicine | Admitting: Emergency Medicine

## 2023-08-26 ENCOUNTER — Encounter (HOSPITAL_BASED_OUTPATIENT_CLINIC_OR_DEPARTMENT_OTHER): Payer: Self-pay

## 2023-08-26 DIAGNOSIS — J029 Acute pharyngitis, unspecified: Secondary | ICD-10-CM | POA: Insufficient documentation

## 2023-08-26 LAB — GROUP A STREP BY PCR: Group A Strep by PCR: NOT DETECTED

## 2023-08-26 MED ORDER — CETIRIZINE HCL 10 MG PO TABS
10.0000 mg | ORAL_TABLET | Freq: Every day | ORAL | 0 refills | Status: DC
  Filled 2023-08-26: qty 30, 30d supply, fill #0

## 2023-08-26 MED ORDER — FLUTICASONE PROPIONATE 50 MCG/ACT NA SUSP
1.0000 | Freq: Every day | NASAL | 2 refills | Status: DC
  Filled 2023-08-26: qty 16, 60d supply, fill #0

## 2023-08-26 MED ORDER — IBUPROFEN 600 MG PO TABS
600.0000 mg | ORAL_TABLET | Freq: Four times a day (QID) | ORAL | 0 refills | Status: DC | PRN
  Filled 2023-08-26: qty 30, 8d supply, fill #0

## 2023-08-26 MED ORDER — KETOROLAC TROMETHAMINE 30 MG/ML IJ SOLN
30.0000 mg | Freq: Once | INTRAMUSCULAR | Status: AC
  Administered 2023-08-26: 30 mg via INTRAMUSCULAR
  Filled 2023-08-26: qty 1

## 2023-08-26 NOTE — ED Triage Notes (Signed)
 Pt presents via POV c/o difficulty swallowing. Reports started yesterday and felt like allergy related.   Also report left sided neck and ear pain.   Pt able to swallow secretions at this time. A&O x4. Airway appears to be intact. Denies taking medications on a regular basis.

## 2023-08-26 NOTE — Discharge Instructions (Signed)
 You were seen today for sore throat.  This could be related to a viral illness versus allergies.  Take medications as prescribed including Zyrtec and ibuprofen.  Use nasal saline or Flonase.  If you develop fevers, difficulty swallowing, any new or worsening symptoms, you should be reevaluated.

## 2023-08-26 NOTE — ED Provider Notes (Signed)
 [place] EMERGENCY DEPARTMENT AT [facility] Provider Note   CSN: [id number] Arrival date & time: 08/26/23  2105     History  Chief Complaint  Patient presents with   Dysphagia    April Wolfe is a 43 y.o. female.  HPI     This 43 year old female who presents with sore throat.  Patient reports 2-day history of worsening sore throat.  She reports pain with swallowing.  She is able to swallow but just has pain.  She also has noted some pain in the left ear and the left side of her neck.  No fevers.  Initially she thought it was related to allergies.  She took an over-the-counter allergy medication and pain medication with minimal relief.  Patient does report an occasional cough.  She states that she feels drainage and is coughing up mucus.  She is concerned because her father had throat cancer.  Of note, her father was a smoker.  Home Medications Prior to Admission medications   Medication Sig Start Date End Date Taking? Authorizing Provider  cetirizine (ZYRTEC ALLERGY) 10 MG tablet Take 1 tablet (10 mg total) by mouth daily. 08/26/23  Yes Bransyn Adami, Mayer Masker, MD  fluticasone (FLONASE) 50 MCG/ACT nasal spray Place 1 spray into both nostrils daily. 08/26/23  Yes Chael Urenda, Mayer Masker, MD  ibuprofen (ADVIL) 600 MG tablet Take 1 tablet (600 mg total) by mouth every 6 (six) hours as needed. 08/26/23  Yes Kareem Aul, Mayer Masker, MD  albuterol (VENTOLIN HFA) 108 (90 Base) MCG/ACT inhaler Inhale 1-2 puffs into the lungs every 6 (six) hours as needed for wheezing or shortness of breath. 06/28/23   Carlisle Beers, FNP  amoxicillin-clavulanate (AUGMENTIN) 875-125 MG tablet Take 1 tablet by mouth 2 (two) times daily. Patient not taking: Reported on 06/28/2023 02/27/23   Waldon Merl, PA-C  fluconazole (DIFLUCAN) 150 MG tablet Take 1 tablet (150 mg total) by mouth daily. Take 1 tablet today, repeat in 3 days if symptoms persist Patient not taking: Reported on 06/28/2023 03/15/23   Landis Martins, PA-C  Naltrexone-buPROPion HCl ER 8-90 MG TB12 2tabs in Am and 1tab in PM x 1week, then 2tabs in AM and PM continuously. Patient not taking: Reported on 06/28/2023 01/24/23   Nche, Bonna Gains, NP  naproxen (NAPROSYN) 500 MG tablet Take 1 tablet (500 mg total) by mouth 2 (two) times daily with a meal. 02/27/23   Waldon Merl, PA-C  ondansetron (ZOFRAN-ODT) 4 MG disintegrating tablet Take 1 tablet (4 mg total) by mouth every 8 (eight) hours as needed for nausea or vomiting. 06/28/23   Carlisle Beers, FNP  oseltamivir (TAMIFLU) 75 MG capsule Take 1 capsule (75 mg total) by mouth every 12 (twelve) hours. 06/28/23   Carlisle Beers, FNP  promethazine (PHENERGAN) 25 MG tablet Take 1 tablet (25 mg total) by mouth every 8 (eight) hours as needed for nausea or vomiting. Patient not taking: Reported on 06/28/2023 01/24/23   NcheBonna Gains, NP  promethazine-dextromethorphan (PROMETHAZINE-DM) 6.25-15 MG/5ML syrup Take 5 mLs by mouth at bedtime as needed for cough. 06/28/23   Carlisle Beers, FNP  scopolamine (TRANSDERM-SCOP) 1 MG/3DAYS Place 1 patch (1.5 mg total) onto the skin every 3 (three) days. 01/24/23   Nche, Bonna Gains, NP      Allergies    Patient has no known allergies.    Review of Systems   Review of Systems  Constitutional:  Negative for fever.  HENT:  Positive for congestion, ear  pain, postnasal drip and sore throat. Negative for trouble swallowing.   Respiratory:  Positive for cough. Negative for shortness of breath.   Cardiovascular:  Negative for chest pain.  All other systems reviewed and are negative.   Physical Exam Updated Vital Signs BP (!) 159/103   Pulse 88   Temp 98.2 F (36.8 C) (Temporal)   Resp 16   Ht 1.6 m (5\' 3" )   Wt 122.5 kg   LMP 11/04/2012   SpO2 98%   BMI 47.83 kg/m  Physical Exam Vitals and nursing note reviewed.  Constitutional:      Appearance: She is well-developed. She is obese.  HENT:     Head: Normocephalic and  atraumatic.     Right Ear: Tympanic membrane and external ear normal.     Left Ear: Tympanic membrane and external ear normal.     Nose: Congestion present.     Mouth/Throat:     Mouth: Mucous membranes are moist.     Comments: No significant erythema, uvula midline, no significant exudate or swelling noted, normal phonation Eyes:     Pupils: Pupils are equal, round, and reactive to light.  Neck:     Comments: Slight bilateral cervical adenopathy Cardiovascular:     Rate and Rhythm: Normal rate and regular rhythm.     Heart sounds: Normal heart sounds.  Pulmonary:     Effort: Pulmonary effort is normal. No respiratory distress.     Breath sounds: No wheezing.  Abdominal:     Palpations: Abdomen is soft.  Musculoskeletal:     Cervical back: Neck supple.  Lymphadenopathy:     Cervical: Cervical adenopathy present.  Skin:    General: Skin is warm and dry.  Neurological:     Mental Status: She is alert and oriented to person, place, and time.  Psychiatric:        Mood and Affect: Mood normal.     ED Results / Procedures / Treatments   Labs (all labs ordered are listed, but only abnormal results are displayed) Labs Reviewed  GROUP A STREP BY PCR    EKG None  Radiology No results found.  Procedures Procedures    Medications Ordered in ED Medications  ketorolac (TORADOL) 30 MG/ML injection 30 mg (30 mg Intramuscular Given 08/26/23 2312)    ED Course/ Medical Decision Making/ A&P                                 Medical Decision Making Risk OTC drugs. Prescription drug management.   This patient presents to the ED for concern of sore throat, this involves an extensive number of treatment options, and is a complaint that carries with it a high risk of complications and morbidity.  I considered the following differential and admission for this acute, potentially life threatening condition.  The differential diagnosis includes bacterial pharyngitis, viral  pharyngitis, allergies, less likely deep space infection, cancer  MDM:    This is a 43 year old female who presents with 2 days of sore throat.  Also has some upper respiratory symptoms.  Favor allergic versus viral.  She has some lymphadenopathy.  No signs or symptoms of deep space infection or asymmetry.  Discussed with her that I would expect cancer to present more indolently.  Would favor treating her supportively at this time.  If symptoms persist, recommend that she follow-up with her primary.  Patient was given a dose of Toradol.  Ibuprofen, Zyrtec, Flonase and nasal saline recommended as an outpatient.  (Labs, imaging, consults)  Labs: I Ordered, and personally interpreted labs.  The pertinent results include: Strep screen negative  Imaging Studies ordered: I ordered imaging studies including none I independently visualized and interpreted imaging. I agree with the radiologist interpretation  Additional history obtained from chart review.  External records from outside source obtained and reviewed including prior evaluations  Cardiac Monitoring: The patient was not maintained on a cardiac monitor.  If on the cardiac monitor, I personally viewed and interpreted the cardiac monitored which showed an underlying rhythm of: N/A  Reevaluation: After the interventions noted above, I reevaluated the patient and found that they have :stayed the same  Social Determinants of Health:  lives independently  Disposition: Discharge  Co morbidities that complicate the patient evaluation  Past Medical History:  Diagnosis Date   Chlamydia 04/16/12   DUB (dysfunctional uterine bleeding)    Epidermolysis bullosa simplex    Fibroids    Gonorrhea 04/16/12   No pertinent past medical history    Obesity      Medicines Meds ordered this encounter  Medications   ketorolac (TORADOL) 30 MG/ML injection 30 mg   ibuprofen (ADVIL) 600 MG tablet    Sig: Take 1 tablet (600 mg total) by mouth  every 6 (six) hours as needed.    Dispense:  30 tablet    Refill:  0   fluticasone (FLONASE) 50 MCG/ACT nasal spray    Sig: Place 1 spray into both nostrils daily.    Dispense:  18.2 mL    Refill:  2   cetirizine (ZYRTEC ALLERGY) 10 MG tablet    Sig: Take 1 tablet (10 mg total) by mouth daily.    Dispense:  30 tablet    Refill:  0    I have reviewed the patients home medicines and have made adjustments as needed  Problem List / ED Course: Problem List Items Addressed This Visit   None Visit Diagnoses       Sore throat    -  Primary                   Final Clinical Impression(s) / ED Diagnoses Final diagnoses:  Sore throat    Rx / DC Orders ED Discharge Orders          Ordered    ibuprofen (ADVIL) 600 MG tablet  Every 6 hours PRN        08/26/23 2331    fluticasone (FLONASE) 50 MCG/ACT nasal spray  Daily        08/26/23 2331    cetirizine (ZYRTEC ALLERGY) 10 MG tablet  Daily        08/26/23 2331              Shon Baton, MD 08/26/23 2347

## 2023-08-27 ENCOUNTER — Other Ambulatory Visit (HOSPITAL_COMMUNITY): Payer: Self-pay

## 2023-08-27 ENCOUNTER — Telehealth: Payer: Self-pay

## 2023-08-27 NOTE — Telephone Encounter (Signed)
 Patient scheduled for ED follow up 08/28/23 TOC completed Patient Questions/Concerns:: Patient concerned of having throat cancer (father has hx) Patient is asking for further evaluation

## 2023-08-27 NOTE — Transitions of Care (Post Inpatient/ED Visit) (Signed)
   08/27/2023  Name: Asianae Minkler MRN: [id number] DOB: 19-Oct-1980  Today's TOC FU Call Status: Today's TOC FU Call Status:: Successful TOC FU Call Completed TOC FU Call Complete Date: 08/27/23 Patient's Name and Date of Birth confirmed.  Transition Care Management Follow-up Telephone Call Date of Discharge: 08/26/23 Discharge Facility: [facility] ([facility]) Type of Discharge: Inpatient Admission Primary Inpatient Discharge Diagnosis:: Sore Throat How have you been since you were released from the hospital?: Better Any questions or concerns?: Yes Patient Questions/Concerns:: Patient concerned of having throat cancer (father has hx) Patient is asking for further evaluation Patient Questions/Concerns Addressed: Notified Provider of Patient Questions/Concerns  Items Reviewed: Did you receive and understand the discharge instructions provided?: Yes Medications obtained,verified, and reconciled?: Yes (Medications Reviewed) Any new allergies since your discharge?: No Dietary orders reviewed?: NA Do you have support at home?: Yes  Medications Reviewed Today: Medications Reviewed Today   Medications were not reviewed in this encounter     Home Care and Equipment/Supplies: Were Home Health Services Ordered?: NA Any new equipment or medical supplies ordered?: NA  Functional Questionnaire: Do you need assistance with bathing/showering or dressing?: No Do you need assistance with meal preparation?: No Do you need assistance with eating?: No Do you have difficulty maintaining continence: No Do you need assistance with getting out of bed/getting out of a chair/moving?: No Do you have difficulty managing or taking your medications?: No  Follow up appointments reviewed: PCP Follow-up appointment confirmed?: Yes Date of PCP follow-up appointment?: 08/28/23 Follow-up Provider: Alysia Penna, NP [facility] Follow-up appointment confirmed?: NA Do you need transportation to  your follow-up appointment?: No Do you understand care options if your condition(s) worsen?: Yes-patient verbalized understanding    SIGNATURE Savio Albrecht D, CMA

## 2023-08-28 ENCOUNTER — Ambulatory Visit: Admitting: Nurse Practitioner

## 2023-08-28 ENCOUNTER — Encounter: Payer: Self-pay | Admitting: Nurse Practitioner

## 2023-08-28 ENCOUNTER — Other Ambulatory Visit (HOSPITAL_COMMUNITY): Payer: Self-pay

## 2023-08-28 VITALS — BP 150/80 | HR 84 | Temp 97.8°F | Ht 63.0 in | Wt 282.2 lb

## 2023-08-28 DIAGNOSIS — Z6841 Body Mass Index (BMI) 40.0 and over, adult: Secondary | ICD-10-CM | POA: Diagnosis not present

## 2023-08-28 DIAGNOSIS — E7800 Pure hypercholesterolemia, unspecified: Secondary | ICD-10-CM

## 2023-08-28 DIAGNOSIS — I10 Essential (primary) hypertension: Secondary | ICD-10-CM

## 2023-08-28 DIAGNOSIS — J029 Acute pharyngitis, unspecified: Secondary | ICD-10-CM | POA: Diagnosis not present

## 2023-08-28 DIAGNOSIS — R7301 Impaired fasting glucose: Secondary | ICD-10-CM | POA: Diagnosis not present

## 2023-08-28 LAB — HEMOGLOBIN A1C: Hgb A1c MFr Bld: 6 % (ref 4.6–6.5)

## 2023-08-28 LAB — COMPREHENSIVE METABOLIC PANEL WITH GFR
ALT: 16 U/L (ref 0–35)
AST: 13 U/L (ref 0–37)
Albumin: 4.2 g/dL (ref 3.5–5.2)
Alkaline Phosphatase: 102 U/L (ref 39–117)
BUN: 12 mg/dL (ref 6–23)
CO2: 30 meq/L (ref 19–32)
Calcium: 9.9 mg/dL (ref 8.4–10.5)
Chloride: 102 meq/L (ref 96–112)
Creatinine, Ser: 0.71 mg/dL (ref 0.40–1.20)
GFR: 102.86 mL/min (ref >60.00)
Glucose, Bld: 122 mg/dL — ABNORMAL HIGH (ref 70–99)
Potassium: 4.1 meq/L (ref 3.5–5.1)
Sodium: 138 meq/L (ref 135–145)
Total Bilirubin: 0.2 mg/dL (ref 0.2–1.2)
Total Protein: 8.6 g/dL — ABNORMAL HIGH (ref 6.0–8.3)

## 2023-08-28 LAB — LDL CHOLESTEROL, DIRECT: Direct LDL: 140 mg/dL

## 2023-08-28 LAB — TSH: TSH: 1.04 u[IU]/mL (ref 0.35–5.50)

## 2023-08-28 MED ORDER — LISINOPRIL-HYDROCHLOROTHIAZIDE 10-12.5 MG PO TABS
1.0000 | ORAL_TABLET | Freq: Every day | ORAL | 3 refills | Status: AC
  Filled 2023-08-28: qty 90, 90d supply, fill #0
  Filled 2024-01-14: qty 90, 90d supply, fill #1

## 2023-08-28 NOTE — Patient Instructions (Signed)
 Start flonase and zyrtec as prescribed Start lisinopril/hydrochlorothiazide for HYPERTENSION Maintain DASH diet Go to lab  DASH Eating Plan DASH stands for Dietary Approaches to Stop Hypertension. The DASH eating plan is a healthy eating plan that has been shown to: Lower high blood pressure (hypertension). Reduce your risk for type 2 diabetes, heart disease, and stroke. Help with weight loss. What are tips for following this plan? Reading food labels Check food labels for the amount of salt (sodium) per serving. Choose foods with less than 5 percent of the Daily Value (DV) of sodium. In general, foods with less than 300 milligrams (mg) of sodium per serving fit into this eating plan. To find whole grains, look for the word "whole" as the first word in the ingredient list. Shopping Buy products labeled as "low-sodium" or "no salt added." Buy fresh foods. Avoid canned foods and pre-made or frozen meals. Cooking Try not to add salt when you cook. Use salt-free seasonings or herbs instead of table salt or sea salt. Check with your health care provider or pharmacist before using salt substitutes. Do not fry foods. Cook foods in healthy ways, such as baking, boiling, grilling, roasting, or broiling. Cook using oils that are good for your heart. These include olive, canola, avocado, soybean, and sunflower oil. Meal planning  Eat a balanced diet. This should include: 4 or more servings of fruits and 4 or more servings of vegetables each day. Try to fill half of your plate with fruits and vegetables. 6-8 servings of whole grains each day. 6 or less servings of lean meat, poultry, or fish each day. 1 oz is 1 serving. A 3 oz (85 g) serving of meat is about the same size as the palm of your hand. One egg is 1 oz (28 g). 2-3 servings of low-fat dairy each day. One serving is 1 cup (237 mL). 1 serving of nuts, seeds, or beans 5 times each week. 2-3 servings of heart-healthy fats. Healthy fats called  omega-3 fatty acids are found in foods such as walnuts, flaxseeds, fortified milks, and eggs. These fats are also found in cold-water fish, such as sardines, salmon, and mackerel. Limit how much you eat of: Canned or prepackaged foods. Food that is high in trans fat, such as fried foods. Food that is high in saturated fat, such as fatty meat. Desserts and other sweets, sugary drinks, and other foods with added sugar. Full-fat dairy products. Do not salt foods before eating. Do not eat more than 4 egg yolks a week. Try to eat at least 2 vegetarian meals a week. Eat more home-cooked food and less restaurant, buffet, and fast food. Lifestyle When eating at a restaurant, ask if your food can be made with less salt or no salt. If you drink alcohol: Limit how much you have to: 0-1 drink a day if you are female. 0-2 drinks a day if you are female. Know how much alcohol is in your drink. In the [place] one drink is one 12 oz bottle of beer (355 mL), one 5 oz glass of wine (148 mL), or one 1 oz glass of hard liquor (44 mL). General information Avoid eating more than 2,300 mg of salt a day. If you have hypertension, you may need to reduce your sodium intake to 1,500 mg a day. Work with your provider to stay at a healthy body weight or lose weight. Ask what the best weight range is for you. On most days of the week, get at least  30 minutes of exercise that causes your heart to beat faster. This may include walking, swimming, or biking. Work with your provider or dietitian to adjust your eating plan to meet your specific calorie needs. What foods should I eat? Fruits All fresh, dried, or frozen fruit. Canned fruits that are in their natural juice and do not have sugar added to them. Vegetables Fresh or frozen vegetables that are raw, steamed, roasted, or grilled. Low-sodium or reduced-sodium tomato and vegetable juice. Low-sodium or reduced-sodium tomato sauce and tomato paste. Low-sodium or  reduced-sodium canned vegetables. Grains Whole-grain or whole-wheat bread. Whole-grain or whole-wheat pasta. Brown rice. Orpah Cobb. Bulgur. Whole-grain and low-sodium cereals. Pita bread. Low-fat, low-sodium crackers. Whole-wheat flour tortillas. Meats and other proteins Skinless chicken or [place]. Ground chicken or [place]. Pork with fat trimmed off. Fish and seafood. Egg whites. Dried beans, peas, or lentils. Unsalted nuts, nut butters, and seeds. Unsalted canned beans. Lean cuts of beef with fat trimmed off. Low-sodium, lean precooked or cured meat, such as sausages or meat loaves. Dairy Low-fat (1%) or fat-free (skim) milk. Reduced-fat, low-fat, or fat-free cheeses. Nonfat, low-sodium ricotta or cottage cheese. Low-fat or nonfat yogurt. Low-fat, low-sodium cheese. Fats and oils Soft margarine without trans fats. Vegetable oil. Reduced-fat, low-fat, or light mayonnaise and salad dressings (reduced-sodium). Canola, safflower, olive, avocado, soybean, and sunflower oils. Avocado. Seasonings and condiments Herbs. Spices. Seasoning mixes without salt. Other foods Unsalted popcorn and pretzels. Fat-free sweets. The items listed above may not be all the foods and drinks you can have. Talk to a dietitian to learn more. What foods should I avoid? Fruits Canned fruit in a light or heavy syrup. Fried fruit. Fruit in cream or butter sauce. Vegetables Creamed or fried vegetables. Vegetables in a cheese sauce. Regular canned vegetables that are not marked as low-sodium or reduced-sodium. Regular canned tomato sauce and paste that are not marked as low-sodium or reduced-sodium. Regular tomato and vegetable juices that are not marked as low-sodium or reduced-sodium. Rosita Fire. Olives. Grains Baked goods made with fat, such as croissants, muffins, or some breads. Dry pasta or rice meal packs. Meats and other proteins Fatty cuts of meat. Ribs. Fried meat. Tomasa Blase. Bologna, salami, and other precooked or  cured meats, such as sausages or meat loaves, that are not lean and low in sodium. Fat from the back of a pig (fatback). Bratwurst. Salted nuts and seeds. Canned beans with added salt. Canned or smoked fish. Whole eggs or egg yolks. Chicken or [place] with skin. Dairy Whole or 2% milk, cream, and half-and-half. Whole or full-fat cream cheese. Whole-fat or sweetened yogurt. Full-fat cheese. Nondairy creamers. Whipped toppings. Processed cheese and cheese spreads. Fats and oils Butter. Stick margarine. Lard. Shortening. Ghee. Bacon fat. Tropical oils, such as coconut, palm kernel, or palm oil. Seasonings and condiments Onion salt, garlic salt, seasoned salt, table salt, and sea salt. Worcestershire sauce. Tartar sauce. Barbecue sauce. Teriyaki sauce. Soy sauce, including reduced-sodium soy sauce. Steak sauce. Canned and packaged gravies. Fish sauce. Oyster sauce. Cocktail sauce. Store-bought horseradish. Ketchup. Mustard. Meat flavorings and tenderizers. Bouillon cubes. Hot sauces. Pre-made or packaged marinades. Pre-made or packaged taco seasonings. Relishes. Regular salad dressings. Other foods Salted popcorn and pretzels. The items listed above may not be all the foods and drinks you should avoid. Talk to a dietitian to learn more. Where to find more information National Heart, Lung, and Blood Institute (NHLBI): BuffaloDryCleaner.gl American Heart Association (AHA): heart.org Academy of Nutrition and Dietetics: eatright.org National Kidney Foundation (NKF): kidney.org This  information is not intended to replace advice given to you by your health care provider. Make sure you discuss any questions you have with your health care provider. Document Revised: 05/30/2022 Document Reviewed: 05/30/2022 Elsevier Patient Education  2024 [employer].

## 2023-08-28 NOTE — Progress Notes (Unsigned)
 Acute Office Visit  Subjective:    Patient ID: April Wolfe, female    DOB: 1980-07-29, 43 y.o.   MRN: [id number]  Chief Complaint  Patient presents with   Follow-up    Recent ED/Hospital f/u-throat feels like throat is closing and hard to swallow, left ear pain     Sore Throat  This is a new problem. The current episode started in the past 7 days. The problem has been unchanged. There has been no fever. Associated symptoms include congestion. Pertinent negatives include no abdominal pain, coughing, diarrhea, drooling, ear discharge, ear pain, headaches, hoarse voice, plugged ear sensation, neck pain, shortness of breath, stridor, swollen glands, trouble swallowing or vomiting. She has had no exposure to strep or mono. She has tried nothing for the symptoms.  Negative strep test, denies any GERD symptoms.  HTN (hypertension), benign Home BP: 190/102 associate with headache and dizziness Admits to high sodium diet and lack of exercise BP Readings from Last 3 Encounters:  08/28/23 (!) 152/88  08/26/23 125/84  06/28/23 (!) 153/95    Check CMP and THYROID Start lisinopril/hydrochlorothiazide 10/12.5mg  Advised to maintain DASH diet F/up in 29month  Impaired fasting glucose Repeat hgbA1c  Morbid obesity (HCC) Advised about the importance of lifestyle modification: DASh diet and daily exercise. Do not resume contrave at this time due to elevated BP. BP Readings from Last 3 Encounters:  08/28/23 (!) 150/80  08/26/23 125/84  06/28/23 (!) 153/95    Repeat THYROID, hgbA1c, and LDL F/up in 29month  Pure hypercholesterolemia Repeat LDL Advised to maintain DASH diet and daily exercise   Wt Readings from Last 3 Encounters:  08/28/23 282 lb 3.2 oz (128 kg)  08/26/23 270 lb (122.5 kg)  01/24/23 267 lb 3.2 oz (121.2 kg)    Outpatient Medications Prior to Visit  Medication Sig   albuterol (VENTOLIN HFA) 108 (90 Base) MCG/ACT inhaler Inhale 1-2 puffs into the lungs every 6 (six)  hours as needed for wheezing or shortness of breath.   cetirizine (ZYRTEC ALLERGY) 10 MG tablet Take 1 tablet (10 mg total) by mouth daily.   fluticasone (FLONASE) 50 MCG/ACT nasal spray Place 1 spray into both nostrils daily.   ibuprofen (ADVIL) 600 MG tablet Take 1 tablet (600 mg total) by mouth every 6 (six) hours as needed.   ondansetron (ZOFRAN-ODT) 4 MG disintegrating tablet Take 1 tablet (4 mg total) by mouth every 8 (eight) hours as needed for nausea or vomiting.   scopolamine (TRANSDERM-SCOP) 1 MG/3DAYS Place 1 patch (1.5 mg total) onto the skin every 3 (three) days.   [DISCONTINUED] fluconazole (DIFLUCAN) 150 MG tablet Take 1 tablet (150 mg total) by mouth daily. Take 1 tablet today, repeat in 3 days if symptoms persist   [DISCONTINUED] Naltrexone-buPROPion HCl ER 8-90 MG TB12 2tabs in Am and 1tab in PM x 1week, then 2tabs in AM and PM continuously.   [DISCONTINUED] naproxen (NAPROSYN) 500 MG tablet Take 1 tablet (500 mg total) by mouth 2 (two) times daily with a meal.   [DISCONTINUED] promethazine (PHENERGAN) 25 MG tablet Take 1 tablet (25 mg total) by mouth every 8 (eight) hours as needed for nausea or vomiting.   [DISCONTINUED] amoxicillin-clavulanate (AUGMENTIN) 875-125 MG tablet Take 1 tablet by mouth 2 (two) times daily. (Patient not taking: Reported on 08/28/2023)   [DISCONTINUED] oseltamivir (TAMIFLU) 75 MG capsule Take 1 capsule (75 mg total) by mouth every 12 (twelve) hours. (Patient not taking: Reported on 08/28/2023)   [DISCONTINUED] promethazine-dextromethorphan (PROMETHAZINE-DM) 6.25-15 MG/5ML syrup  Take 5 mLs by mouth at bedtime as needed for cough. (Patient not taking: Reported on 08/28/2023)   No facility-administered medications prior to visit.    Reviewed past medical and social history.  Review of Systems  HENT:  Positive for congestion. Negative for drooling, ear discharge, ear pain, hoarse voice and trouble swallowing.   Respiratory:  Negative for cough, shortness of  breath and stridor.   Gastrointestinal:  Negative for abdominal pain, diarrhea and vomiting.  Musculoskeletal:  Negative for neck pain.  Neurological:  Negative for headaches.   Per HPI     Objective:    Physical Exam Vitals and nursing note reviewed.  HENT:     Mouth/Throat:     Mouth: Mucous membranes are moist.     Pharynx: Oropharynx is clear. Uvula midline.     Tonsils: No tonsillar exudate.  Cardiovascular:     Rate and Rhythm: Normal rate and regular rhythm.     Pulses: Normal pulses.     Heart sounds: Normal heart sounds.  Pulmonary:     Effort: Pulmonary effort is normal.     Breath sounds: Normal breath sounds.  Musculoskeletal:     Cervical back: Normal range of motion and neck supple.     Right lower leg: Edema present.     Left lower leg: Edema present.  Lymphadenopathy:     Cervical: No cervical adenopathy.  Neurological:     Mental Status: She is alert and oriented to person, place, and time.    BP (!) 150/80 (BP Location: Left Arm, Patient Position: Supine, Cuff Size: Large)   Pulse 84   Temp 97.8 F (36.6 C) (Temporal)   Ht 5\' 3"  (1.6 m)   Wt 282 lb 3.2 oz (128 kg)   LMP 11/04/2012   SpO2 98%   BMI 49.99 kg/m  {Vitals History (Optional):23777}  No results found for any visits on 08/28/23.     Assessment & Plan:   Problem List Items Addressed This Visit     HTN (hypertension), benign - Primary   Home BP: 190/102 associate with headache and dizziness Admits to high sodium diet and lack of exercise BP Readings from Last 3 Encounters:  08/28/23 (!) 152/88  08/26/23 125/84  06/28/23 (!) 153/95    Check CMP and THYROID Start lisinopril/hydrochlorothiazide 10/12.5mg  Advised to maintain DASH diet F/up in 230month      Relevant Medications   lisinopril-hydrochlorothiazide (ZESTORETIC) 10-12.5 MG tablet   Other Relevant Orders   Comprehensive metabolic panel with GFR   TSH   Hemoglobin A1c   Direct LDL   Impaired fasting glucose   Repeat  hgbA1c      Morbid obesity (HCC)   Advised about the importance of lifestyle modification: DASh diet and daily exercise. Do not resume contrave at this time due to elevated BP. BP Readings from Last 3 Encounters:  08/28/23 (!) 150/80  08/26/23 125/84  06/28/23 (!) 153/95    Repeat THYROID, hgbA1c, and LDL F/up in 230month      Relevant Orders   Comprehensive metabolic panel with GFR   TSH   Hemoglobin A1c   Pure hypercholesterolemia   Repeat LDL Advised to maintain DASH diet and daily exercise      Relevant Medications   lisinopril-hydrochlorothiazide (ZESTORETIC) 10-12.5 MG tablet   Other Relevant Orders   Direct LDL   Other Visit Diagnoses       Acute pharyngitis, unspecified etiology         Advised to take zyrtec  and flonase as prescribed Meds ordered this encounter  Medications   lisinopril-hydrochlorothiazide (ZESTORETIC) 10-12.5 MG tablet    Sig: Take 1 tablet by mouth daily.    Dispense:  90 tablet    Refill:  3    Supervising Provider:   Mliss Sax [[id number]]   Return in about 4 weeks (around 09/25/2023) for HTN (get ECG).    Alysia Penna, NP

## 2023-08-28 NOTE — Assessment & Plan Note (Signed)
 Home BP: 190/102 associate with headache and dizziness Admits to high sodium diet and lack of exercise BP Readings from Last 3 Encounters:  08/28/23 (!) 152/88  08/26/23 125/84  06/28/23 (!) 153/95    Check CMP and THYROID Start lisinopril/hydrochlorothiazide 10/12.5mg  Advised to maintain DASH diet F/up in 33month

## 2023-08-28 NOTE — Assessment & Plan Note (Signed)
 Repeat LDL Advised to maintain DASH diet and daily exercise

## 2023-08-28 NOTE — Assessment & Plan Note (Signed)
 Advised about the importance of lifestyle modification: DASh diet and daily exercise. Do not resume contrave at this time due to elevated BP. BP Readings from Last 3 Encounters:  08/28/23 (!) 150/80  08/26/23 125/84  06/28/23 (!) 153/95    Repeat THYROID, hgbA1c, and LDL F/up in 70month

## 2023-08-28 NOTE — Assessment & Plan Note (Signed)
 Repeat hgbA1c

## 2023-08-29 ENCOUNTER — Encounter: Payer: Self-pay | Admitting: Nurse Practitioner

## 2023-08-31 ENCOUNTER — Encounter: Payer: Self-pay | Admitting: Nurse Practitioner

## 2023-09-26 ENCOUNTER — Encounter: Payer: Self-pay | Admitting: Nurse Practitioner

## 2023-09-26 ENCOUNTER — Ambulatory Visit: Admitting: Nurse Practitioner

## 2023-09-26 VITALS — BP 138/84 | HR 102 | Temp 98.0°F | Ht 63.0 in | Wt 281.6 lb

## 2023-09-26 DIAGNOSIS — I10 Essential (primary) hypertension: Secondary | ICD-10-CM

## 2023-09-26 NOTE — Patient Instructions (Signed)
 Maintain med doses  DASH Eating Plan DASH stands for Dietary Approaches to Stop Hypertension. The DASH eating plan is a healthy eating plan that has been shown to: Lower high blood pressure (hypertension). Reduce your risk for type 2 diabetes, heart disease, and stroke. Help with weight loss. What are tips for following this plan? Reading food labels Check food labels for the amount of salt (sodium) per serving. Choose foods with less than 5 percent of the Daily Value (DV) of sodium. In general, foods with less than 300 milligrams (mg) of sodium per serving fit into this eating plan. To find whole grains, look for the word "whole" as the first word in the ingredient list. Shopping Buy products labeled as "low-sodium" or "no salt added." Buy fresh foods. Avoid canned foods and pre-made or frozen meals. Cooking Try not to add salt when you cook. Use salt-free seasonings or herbs instead of table salt or sea salt. Check with your health care provider or pharmacist before using salt substitutes. Do not fry foods. Cook foods in healthy ways, such as baking, boiling, grilling, roasting, or broiling. Cook using oils that are good for your heart. These include olive, canola, avocado, soybean, and sunflower oil. Meal planning  Eat a balanced diet. This should include: 4 or more servings of fruits and 4 or more servings of vegetables each day. Try to fill half of your plate with fruits and vegetables. 6-8 servings of whole grains each day. 6 or less servings of lean meat, poultry, or fish each day. 1 oz is 1 serving. A 3 oz (85 g) serving of meat is about the same size as the palm of your hand. One egg is 1 oz (28 g). 2-3 servings of low-fat dairy each day. One serving is 1 cup (237 mL). 1 serving of nuts, seeds, or beans 5 times each week. 2-3 servings of heart-healthy fats. Healthy fats called omega-3 fatty acids are found in foods such as walnuts, flaxseeds, fortified milks, and eggs. These fats  are also found in cold-water  fish, such as sardines, salmon, and mackerel. Limit how much you eat of: Canned or prepackaged foods. Food that is high in trans fat, such as fried foods. Food that is high in saturated fat, such as fatty meat. Desserts and other sweets, sugary drinks, and other foods with added sugar. Full-fat dairy products. Do not salt foods before eating. Do not eat more than 4 egg yolks a week. Try to eat at least 2 vegetarian meals a week. Eat more home-cooked food and less restaurant, buffet, and fast food. Lifestyle When eating at a restaurant, ask if your food can be made with less salt or no salt. If you drink alcohol: Limit how much you have to: 0-1 drink a day if you are female. 0-2 drinks a day if you are female. Know how much alcohol is in your drink. In the [place] one drink is one 12 oz bottle of beer (355 mL), one 5 oz glass of wine (148 mL), or one 1 oz glass of hard liquor (44 mL). General information Avoid eating more than 2,300 mg of salt a day. If you have hypertension, you may need to reduce your sodium intake to 1,500 mg a day. Work with your provider to stay at a healthy body weight or lose weight. Ask what the best weight range is for you. On most days of the week, get at least 30 minutes of exercise that causes your heart to beat faster. This may  include walking, swimming, or biking. Work with your provider or dietitian to adjust your eating plan to meet your specific calorie needs. What foods should I eat? Fruits All fresh, dried, or frozen fruit. Canned fruits that are in their natural juice and do not have sugar added to them. Vegetables Fresh or frozen vegetables that are raw, steamed, roasted, or grilled. Low-sodium or reduced-sodium tomato and vegetable juice. Low-sodium or reduced-sodium tomato sauce and tomato paste. Low-sodium or reduced-sodium canned vegetables. Grains Whole-grain or whole-wheat bread. Whole-grain or whole-wheat pasta. Brown  rice. Dwyane Glad. Bulgur. Whole-grain and low-sodium cereals. Pita bread. Low-fat, low-sodium crackers. Whole-wheat flour tortillas. Meats and other proteins Skinless chicken or [place]. Ground chicken or [place]. Pork with fat trimmed off. Fish and seafood. Egg whites. Dried beans, peas, or lentils. Unsalted nuts, nut butters, and seeds. Unsalted canned beans. Lean cuts of beef with fat trimmed off. Low-sodium, lean precooked or cured meat, such as sausages or meat loaves. Dairy Low-fat (1%) or fat-free (skim) milk. Reduced-fat, low-fat, or fat-free cheeses. Nonfat, low-sodium ricotta or cottage cheese. Low-fat or nonfat yogurt. Low-fat, low-sodium cheese. Fats and oils Soft margarine without trans fats. Vegetable oil. Reduced-fat, low-fat, or light mayonnaise and salad dressings (reduced-sodium). Canola, safflower, olive, avocado, soybean, and sunflower oils. Avocado. Seasonings and condiments Herbs. Spices. Seasoning mixes without salt. Other foods Unsalted popcorn and pretzels. Fat-free sweets. The items listed above may not be all the foods and drinks you can have. Talk to a dietitian to learn more. What foods should I avoid? Fruits Canned fruit in a light or heavy syrup. Fried fruit. Fruit in cream or butter sauce. Vegetables Creamed or fried vegetables. Vegetables in a cheese sauce. Regular canned vegetables that are not marked as low-sodium or reduced-sodium. Regular canned tomato sauce and paste that are not marked as low-sodium or reduced-sodium. Regular tomato and vegetable juices that are not marked as low-sodium or reduced-sodium. Vanessa General. Olives. Grains Baked goods made with fat, such as croissants, muffins, or some breads. Dry pasta or rice meal packs. Meats and other proteins Fatty cuts of meat. Ribs. Fried meat. Helene Loader. Bologna, salami, and other precooked or cured meats, such as sausages or meat loaves, that are not lean and low in sodium. Fat from the back of a pig (fatback).  Bratwurst. Salted nuts and seeds. Canned beans with added salt. Canned or smoked fish. Whole eggs or egg yolks. Chicken or [place] with skin. Dairy Whole or 2% milk, cream, and half-and-half. Whole or full-fat cream cheese. Whole-fat or sweetened yogurt. Full-fat cheese. Nondairy creamers. Whipped toppings. Processed cheese and cheese spreads. Fats and oils Butter. Stick margarine. Lard. Shortening. Ghee. Bacon fat. Tropical oils, such as coconut, palm kernel, or palm oil. Seasonings and condiments Onion salt, garlic salt, seasoned salt, table salt, and sea salt. Worcestershire sauce. Tartar sauce. Barbecue sauce. Teriyaki sauce. Soy sauce, including reduced-sodium soy sauce. Steak sauce. Canned and packaged gravies. Fish sauce. Oyster sauce. Cocktail sauce. Store-bought horseradish. Ketchup. Mustard. Meat flavorings and tenderizers. Bouillon cubes. Hot sauces. Pre-made or packaged marinades. Pre-made or packaged taco seasonings. Relishes. Regular salad dressings. Other foods Salted popcorn and pretzels. The items listed above may not be all the foods and drinks you should avoid. Talk to a dietitian to learn more. Where to find more information National Heart, Lung, and Blood Institute (NHLBI): [employer]ry[employer].gl American Heart Association (AHA): heart.org Academy of Nutrition and Dietetics: eatright.org National Kidney Foundation (NKF): kidney.org This information is not intended to replace advice given to you by your health  care provider. Make sure you discuss any questions you have with your health care provider. Document Revised: 05/30/2022 Document Reviewed: 05/30/2022 Elsevier Patient Education  2024 [employer].

## 2023-09-26 NOTE — Progress Notes (Signed)
                Established Patient Visit  Patient: April Wolfe   DOB: Jul 25, 1980   43 y.o. Female  MRN: [id number] Visit Date: 09/26/2023  Subjective:    Chief Complaint  Patient presents with   Follow-up    4 week f/u for HTN    HPI HTN (hypertension), benign Improved BP control No advised effects with lisinopril /hydrochlorothiazide  BP Readings from Last 3 Encounters:  09/26/23 138/84  08/28/23 (!) 150/80  08/26/23 125/84    Maintain med dose and DASH diet F/up in 75month  Wt Readings from Last 3 Encounters:  09/26/23 281 lb 9.6 oz (127.7 kg)  08/28/23 282 lb 3.2 oz (128 kg)  08/26/23 270 lb (122.5 kg)    Reviewed medical, surgical, and social history today  Medications: Outpatient Medications Prior to Visit  Medication Sig   albuterol  (VENTOLIN  HFA) 108 (90 Base) MCG/ACT inhaler Inhale 1-2 puffs into the lungs every 6 (six) hours as needed for wheezing or shortness of breath.   cetirizine  (ZYRTEC  ALLERGY) 10 MG tablet Take 1 tablet (10 mg total) by mouth daily.   fluticasone  (FLONASE ) 50 MCG/ACT nasal spray Place 1 spray into both nostrils daily.   ibuprofen  (ADVIL ) 600 MG tablet Take 1 tablet (600 mg total) by mouth every 6 (six) hours as needed.   lisinopril -hydrochlorothiazide  (ZESTORETIC ) 10-12.5 MG tablet Take 1 tablet by mouth daily.   ondansetron  (ZOFRAN -ODT) 4 MG disintegrating tablet Take 1 tablet (4 mg total) by mouth every 8 (eight) hours as needed for nausea or vomiting.   scopolamine  (TRANSDERM-SCOP) 1 MG/3DAYS Place 1 patch (1.5 mg total) onto the skin every 3 (three) days.   No facility-administered medications prior to visit.   Reviewed past medical and social history.   ROS per HPI above      Objective:  BP 138/84 (BP Location: Left Arm, Patient Position: Sitting, Cuff Size: Large)   Pulse (!) 102   Temp 98 F (36.7 C) (Temporal)   Ht 5\' 3"  (1.6 m)   Wt 281 lb 9.6 oz (127.7 kg)   LMP 11/04/2012   SpO2 97%   BMI 49.88 kg/m      Physical  Exam Vitals and nursing note reviewed.  Cardiovascular:     Rate and Rhythm: Normal rate and regular rhythm.     Pulses: Normal pulses.  Pulmonary:     Effort: Pulmonary effort is normal.  Neurological:     Mental Status: She is alert and oriented to person, place, and time.     No results found for any visits on 09/26/23.    Assessment & Plan:    Problem List Items Addressed This Visit     HTN (hypertension), benign - Primary   Improved BP control No advised effects with lisinopril /hydrochlorothiazide  BP Readings from Last 3 Encounters:  09/26/23 138/84  08/28/23 (!) 150/80  08/26/23 125/84    Maintain med dose and DASH diet F/up in 75month      Return in about 4 weeks (around 10/24/2023) for HTN.     Kathrene Parents, NP

## 2023-09-26 NOTE — Assessment & Plan Note (Signed)
 Improved BP control No advised effects with lisinopril /hydrochlorothiazide  BP Readings from Last 3 Encounters:  09/26/23 138/84  08/28/23 (!) 150/80  08/26/23 125/84    Maintain med dose and DASH diet F/up in 63month

## 2023-10-24 ENCOUNTER — Ambulatory Visit: Admitting: Nurse Practitioner

## 2023-12-23 ENCOUNTER — Ambulatory Visit: Admitting: Nurse Practitioner

## 2023-12-29 ENCOUNTER — Telehealth: Payer: Self-pay | Admitting: Nurse Practitioner

## 2023-12-29 NOTE — Telephone Encounter (Signed)
 10/24/2023 same day cancel 12/23/2023 no show  Final warning sent via mail and mychart

## 2024-01-14 ENCOUNTER — Encounter (HOSPITAL_COMMUNITY): Payer: Self-pay | Admitting: *Deleted

## 2024-01-14 ENCOUNTER — Ambulatory Visit (HOSPITAL_COMMUNITY)
Admission: EM | Admit: 2024-01-14 | Discharge: 2024-01-14 | Disposition: A | Discharge status: Home / Self Care | Attending: Family Medicine | Admitting: Family Medicine

## 2024-01-14 ENCOUNTER — Other Ambulatory Visit: Payer: Self-pay

## 2024-01-14 DIAGNOSIS — Z113 Encounter for screening for infections with a predominantly sexual mode of transmission: Secondary | ICD-10-CM | POA: Diagnosis not present

## 2024-01-14 DIAGNOSIS — Z202 Contact with and (suspected) exposure to infections with a predominantly sexual mode of transmission: Secondary | ICD-10-CM

## 2024-01-14 LAB — POCT URINALYSIS DIP (MANUAL ENTRY)
Bilirubin, UA: NEGATIVE
Glucose, UA: NEGATIVE mg/dL
Ketones, POC UA: NEGATIVE mg/dL
Nitrite, UA: NEGATIVE
Protein Ur, POC: NEGATIVE mg/dL
Spec Grav, UA: 1.025 (ref 1.010–1.025)
Urobilinogen, UA: 0.2 U/dL
pH, UA: 5.5 (ref 5.0–8.0)

## 2024-01-14 LAB — HIV ANTIBODY (ROUTINE TESTING W REFLEX): HIV Screen 4th Generation wRfx: NONREACTIVE

## 2024-01-14 MED ORDER — DOXYCYCLINE HYCLATE 100 MG PO CAPS
100.0000 mg | ORAL_CAPSULE | Freq: Two times a day (BID) | ORAL | 0 refills | Status: DC
  Filled 2024-01-14: qty 14, 7d supply, fill #0

## 2024-01-14 MED ORDER — METRONIDAZOLE 500 MG PO TABS
2000.0000 mg | ORAL_TABLET | Freq: Once | ORAL | 0 refills | Status: AC
  Filled 2024-01-14: qty 4, 1d supply, fill #0

## 2024-01-14 NOTE — ED Triage Notes (Addendum)
 PT was informed by her partner he was positive for STD. Pt reports vag itching and has used Boirc-Acid . Pt still has vaginal itching.. Pt wants STD testing. PT also reported her urine is cloudy

## 2024-01-14 NOTE — Discharge Instructions (Addendum)
 In addition to a urine culture, we have sent testing for sexually transmitted infections. We will notify you of any positive results once they are received. If required, we will prescribe any medications you might need.  Please refrain from all sexual activity for at least the next seven days.  Meds ordered this encounter  Medications   metroNIDAZOLE  (FLAGYL ) 500 MG tablet    Sig: Take 4 tablets (2,000 mg total) by mouth once for 1 dose.    Dispense:  4 tablet    Refill:  0   doxycycline  (VIBRAMYCIN ) 100 MG capsule    Sig: Take 1 capsule (100 mg total) by mouth 2 (two) times daily.    Dispense:  14 capsule    Refill:  0

## 2024-01-15 ENCOUNTER — Ambulatory Visit (HOSPITAL_COMMUNITY): Payer: Self-pay

## 2024-01-15 ENCOUNTER — Other Ambulatory Visit (HOSPITAL_COMMUNITY): Payer: Self-pay

## 2024-01-15 LAB — CERVICOVAGINAL ANCILLARY ONLY
Bacterial Vaginitis (gardnerella): POSITIVE — AB
Candida Glabrata: NEGATIVE
Candida Vaginitis: NEGATIVE
Chlamydia: NEGATIVE
Comment: NEGATIVE
Comment: NEGATIVE
Comment: NEGATIVE
Comment: NEGATIVE
Comment: NEGATIVE
Comment: NORMAL
Neisseria Gonorrhea: NEGATIVE
Trichomonas: POSITIVE — AB

## 2024-01-15 LAB — URINE CULTURE: Culture: <10000 — AB

## 2024-01-15 LAB — RPR: RPR Ser Ql: NONREACTIVE

## 2024-01-15 MED ORDER — METRONIDAZOLE 500 MG PO TABS
500.0000 mg | ORAL_TABLET | Freq: Two times a day (BID) | ORAL | 0 refills | Status: DC
  Filled 2024-01-15: qty 14, 7d supply, fill #0

## 2024-01-17 NOTE — ED Provider Notes (Signed)
 [facility]   [id number] 01/14/24 Arrival Time: 1826  ASSESSMENT & PLAN:  1. STD exposure    Will empirically tx for trich/chlamydia.   Discharge Instructions      In addition to a urine culture, we have sent testing for sexually transmitted infections. We will notify you of any positive results once they are received. If required, we will prescribe any medications you might need.  Please refrain from all sexual activity for at least the next seven days.  Meds ordered this encounter  Medications   metroNIDAZOLE  (FLAGYL ) 500 MG tablet    Sig: Take 4 tablets (2,000 mg total) by mouth once for 1 dose.    Dispense:  4 tablet    Refill:  0   doxycycline  (VIBRAMYCIN ) 100 MG capsule    Sig: Take 1 capsule (100 mg total) by mouth 2 (two) times daily.    Dispense:  14 capsule    Refill:  0       Without s/s of PID.  Cervical cytology/HIV/RPR pending. Will notify of any positive results. Instructed to refrain from sexual activity for at least seven days.  Reviewed expectations re: course of current medical issues. Questions answered. Outlined signs and symptoms indicating need for more acute intervention. Patient verbalized understanding. After Visit Summary given.   SUBJECTIVE:  April Wolfe is a 43 y.o. female who was informed by her partner he was positive for STD. Chlamydia/trich. Pt reports vag itching and has used Boirc-Acid . Pt still has vaginal itching.. Pt wants STD testing. PT also reported her urine is cloudy   Patient's last menstrual period was 11/04/2012.   OBJECTIVE:  Vitals:   01/14/24 1908  BP: (!) 159/95  Pulse: 84  Resp: 20  Temp: 98.6 F (37 C)  SpO2: 98%     General appearance: alert, cooperative, appears stated age and no distress Lungs: unlabored respirations; speaks full sentences without difficulty Back: no CVA tenderness; FROM at waist Abdomen: soft, non-tender GU: normal external genitalia (RN chaperone present); she  requests that provider do her vaginal swab Skin: warm and dry Psychological: alert and cooperative; normal mood and affect.  Results for orders placed or performed during the hospital encounter of 01/14/24  Cervicovaginal ancillary only   Collection Time: 01/14/24  7:20 PM  Result Value Ref Range   Neisseria Gonorrhea Negative    Chlamydia Negative    Trichomonas Positive (A)    Bacterial Vaginitis (gardnerella) Positive (A)    Candida Vaginitis Negative    Candida Glabrata Negative    Comment      Normal Reference Range Bacterial Vaginosis - Negative   Comment Normal Reference Range Candida Species - Negative    Comment Normal Reference Range Candida Galbrata - Negative    Comment Normal Reference Range Trichomonas - Negative    Comment Normal Reference Ranger Chlamydia - Negative    Comment      Normal Reference Range Neisseria Gonorrhea - Negative  POCT urinalysis dipstick   Collection Time: 01/14/24  7:23 PM  Result Value Ref Range   Color, UA yellow yellow   Clarity, UA turbid (A) clear   Glucose, UA negative negative mg/dL   Bilirubin, UA negative negative   Ketones, POC UA negative negative mg/dL   Spec Grav, UA 8.974 8.989 - 1.025   Blood, UA small (A) negative   pH, UA 5.5 5.0 - 8.0   Protein Ur, POC negative negative mg/dL   Urobilinogen, UA 0.2 0.2 or 1.0 E.U./dL  Nitrite, UA Negative Negative   Leukocytes, UA Small (1+) (A) Negative  HIV Antibody (routine testing w rflx)   Collection Time: 01/14/24  7:29 PM  Result Value Ref Range   HIV Screen 4th Generation wRfx Non Reactive Non Reactive  RPR   Collection Time: 01/14/24  7:29 PM  Result Value Ref Range   RPR Ser Ql NON REACTIVE NON REACTIVE  Urine Culture   Collection Time: 01/14/24  7:40 PM   Specimen: Urine, Clean Catch  Result Value Ref Range   Specimen Description URINE, CLEAN CATCH    Special Requests NONE    Culture (A)     <10,000 COLONIES/mL INSIGNIFICANT GROWTH Performed at [facility], [address] [place], [place] [address]    Report Status 01/15/2024 FINAL     Labs Reviewed  URINE CULTURE - Abnormal; Notable for the following components:      Result Value   Culture   (*)    Value: <10,000 COLONIES/mL INSIGNIFICANT GROWTH Performed at [facility], [address] [place], [place] [address]    All other components within normal limits  POCT URINALYSIS DIP (MANUAL ENTRY) - Abnormal; Notable for the following components:   Clarity, UA turbid (*)    Blood, UA small (*)    Leukocytes, UA Small (1+) (*)    All other components within normal limits  CERVICOVAGINAL ANCILLARY ONLY - Abnormal; Notable for the following components:   Trichomonas Positive (*)    Bacterial Vaginitis (gardnerella) Positive (*)    All other components within normal limits  HIV ANTIBODY (ROUTINE TESTING W REFLEX)  RPR    No Known Allergies  Past Medical History:  Diagnosis Date   Chlamydia 04/16/12   DUB (dysfunctional uterine bleeding)    Epidermolysis bullosa simplex    Fibroids    Gonorrhea 04/16/12   No pertinent past medical history    Obesity    Family History  Problem Relation Age of Onset   Hypertension Mother    Diabetes Mother    Hypertension Father    Hypertension Brother    Sarcoidosis Brother    Kidney disease Brother    Hypertension Brother    Diabetes Brother    Breast cancer Neg Hx    Social History   Socioeconomic History   Marital status: Married    Spouse name: Not on file   Number of children: 1   Years of education: Not on file   Highest education level: Not on file  Occupational History   Not on file  Tobacco Use   Smoking status: Every Day    Current packs/day: 0.00    Average packs/day: 0.3 packs/day for 28.0 years (7.0 ttl pk-yrs)    Types: Cigarettes    Start date: 07/11/1992    Last attempt to quit: 07/11/2020    Years since quitting: 3.5    Passive exposure: Never   Smokeless tobacco: Never  Vaping Use   Vaping  status: Never Used  Substance and Sexual Activity   Alcohol use: Yes    Alcohol/week: 1.0 standard drink of alcohol    Types: 1 Standard drinks or equivalent per week    Comment: socially   Drug use: No   Sexual activity: Yes    Birth control/protection: Surgical  Other Topics Concern   Not on file  Social History Narrative   Not on file   Social Drivers of Health   Financial Resource Strain: Not on file  Food Insecurity:  Not on file  Transportation Needs: Not on file  Physical Activity: Not on file  Stress: Not on file  Social Connections: Not on file  Intimate Partner Violence: Not on file           Rolinda Rogue, MD 01/17/24 1005

## 2024-02-03 DIAGNOSIS — H52223 Regular astigmatism, bilateral: Secondary | ICD-10-CM | POA: Diagnosis not present

## 2024-02-03 DIAGNOSIS — H5212 Myopia, left eye: Secondary | ICD-10-CM | POA: Diagnosis not present

## 2024-02-03 DIAGNOSIS — H524 Presbyopia: Secondary | ICD-10-CM | POA: Diagnosis not present

## 2024-02-03 DIAGNOSIS — H5201 Hypermetropia, right eye: Secondary | ICD-10-CM | POA: Diagnosis not present

## 2024-02-10 ENCOUNTER — Ambulatory Visit (HOSPITAL_COMMUNITY): Payer: Self-pay

## 2024-03-03 ENCOUNTER — Telehealth: Admitting: Physician Assistant

## 2024-03-03 DIAGNOSIS — R82998 Other abnormal findings in urine: Secondary | ICD-10-CM

## 2024-03-03 NOTE — Progress Notes (Signed)
  Because of dark red/brown urine and need to assess kidneys, I feel your condition warrants further evaluation and I recommend that you be seen in a face-to-face visit.   NOTE: There will be NO CHARGE for this E-Visit   If you are having a true medical emergency, please call 911.     For an urgent face to face visit, [place] has multiple urgent care centers for your convenience.  Click the link below for the full list of locations and hours, walk-in wait times, appointment scheduling options and driving directions:  Urgent Care - [place], [place], [place], [place], [place], [place]  [place]     Your MyChart E-visit questionnaire answers were reviewed by a board certified advanced clinical practitioner to complete your personal care plan based on your specific symptoms.    Thank you for using e-Visits.

## 2024-03-04 ENCOUNTER — Other Ambulatory Visit (HOSPITAL_COMMUNITY): Payer: Self-pay

## 2024-03-04 ENCOUNTER — Telehealth: Admitting: Physician Assistant

## 2024-03-04 DIAGNOSIS — R3989 Other symptoms and signs involving the genitourinary system: Secondary | ICD-10-CM

## 2024-03-04 MED ORDER — CEPHALEXIN 500 MG PO CAPS
500.0000 mg | ORAL_CAPSULE | Freq: Two times a day (BID) | ORAL | 0 refills | Status: DC
  Filled 2024-03-04: qty 14, 7d supply, fill #0

## 2024-03-04 NOTE — Progress Notes (Signed)
 Message sent to patient requesting further input regarding current symptoms. Awaiting patient response.

## 2024-03-04 NOTE — Progress Notes (Signed)

## 2024-03-08 ENCOUNTER — Telehealth: Payer: Self-pay | Admitting: Nurse Practitioner

## 2024-03-08 NOTE — Telephone Encounter (Unsigned)
 Copied from CRM [id number]. Topic: Clinical - Pink Word Triage >> Mar 08, 2024 12:31 PM Eva FALCON wrote: [place] Word triggered transfer to Nurse Triage. See Triage Message for details.

## 2024-03-09 ENCOUNTER — Ambulatory Visit: Payer: Self-pay | Admitting: *Deleted

## 2024-03-09 NOTE — Telephone Encounter (Signed)
 FYI Only or Action Required?: FYI only for provider.  Patient was last seen in primary care on 09/26/2023 by Nche, Roselie Rockford, NP.  Called Nurse Triage reporting Hematuria.  Symptoms began several days ago.  Interventions attempted: Prescription medications: cephalexin  .  Symptoms are: gradually worsening.  Triage Disposition: See PCP Within 2 Weeks  Patient/caregiver understands and will follow disposition?: Yes               Reason for Disposition  All other urine symptoms  Answer Assessment - Initial Assessment Questions Appt scheduled for 03/11/24 with PCP. Patient completed telehealth visit 03/04/24 and started cephlaxin for UTi sx. Continues with sx and small amount of blood in urine. Reports vaginal itching,and white discharge. Requesting if medication can be sent in for possible yeast infection.       1. SYMPTOM: What's the main symptom you're concerned about? (e.g., frequency, incontinence)     blood in urine  even with taking cephalexin   2. ONSET: When did the  sx  start?     03/04/24 3. PAIN: Is there any pain? If Yes, ask: How bad is it? (Scale: 1-10; mild, moderate, severe)     No pain  4. CAUSE: What do you think is causing the symptoms?     Possible UTi being treated with cephalaxin  5. OTHER SYMPTOMS: Do you have any other symptoms? (e.g., blood in urine, fever, flank pain, pain with urination)     Blood in urine noted small amount , vaginal itching and white discharge 6. PREGNANCY: Is there any chance you are pregnant? When was your last menstrual period?     na  Protocols used: Urinary Symptoms-A-AH

## 2024-03-11 ENCOUNTER — Encounter: Payer: Self-pay | Admitting: Nurse Practitioner

## 2024-03-11 ENCOUNTER — Ambulatory Visit: Payer: 59 | Admitting: Dermatology

## 2024-03-11 ENCOUNTER — Other Ambulatory Visit (HOSPITAL_COMMUNITY)
Admission: RE | Admit: 2024-03-11 | Discharge: 2024-03-11 | Disposition: A | Discharge status: Home / Self Care | Source: Ambulatory Visit | Attending: Nurse Practitioner | Admitting: Nurse Practitioner

## 2024-03-11 ENCOUNTER — Ambulatory Visit (INDEPENDENT_AMBULATORY_CARE_PROVIDER_SITE_OTHER): Admitting: Nurse Practitioner

## 2024-03-11 VITALS — BP 142/84 | HR 91 | Temp 97.9°F | Ht 63.0 in | Wt 277.0 lb

## 2024-03-11 DIAGNOSIS — Z23 Encounter for immunization: Secondary | ICD-10-CM

## 2024-03-11 DIAGNOSIS — N898 Other specified noninflammatory disorders of vagina: Secondary | ICD-10-CM | POA: Diagnosis not present

## 2024-03-11 DIAGNOSIS — R8290 Unspecified abnormal findings in urine: Secondary | ICD-10-CM | POA: Diagnosis not present

## 2024-03-11 LAB — POC URINALSYSI DIPSTICK (AUTOMATED)
Glucose, UA: NEGATIVE
Ketones, UA: NEGATIVE
Leukocytes, UA: NEGATIVE
Nitrite, UA: NEGATIVE
Protein, UA: POSITIVE — AB
Spec Grav, UA: >=1.03 — AB (ref 1.010–1.025)
Urobilinogen, UA: NEGATIVE U/dL — AB
pH, UA: 6 (ref 5.0–8.0)

## 2024-03-11 LAB — CERVICOVAGINAL ANCILLARY ONLY
Bacterial Vaginitis (gardnerella): POSITIVE — AB
Candida Glabrata: NEGATIVE
Candida Vaginitis: NEGATIVE
Chlamydia: NEGATIVE
Comment: NEGATIVE
Comment: NEGATIVE
Comment: NEGATIVE
Comment: NEGATIVE
Comment: NEGATIVE
Comment: NORMAL
Neisseria Gonorrhea: NEGATIVE
Trichomonas: POSITIVE — AB

## 2024-03-11 NOTE — Patient Instructions (Signed)
Maintain adequate oral hydration

## 2024-03-11 NOTE — Progress Notes (Signed)
 Acute Office Visit  Subjective:    Patient ID: April Wolfe, female    DOB: 1981-02-01, 43 y.o.   MRN: [id number]  Chief Complaint  Patient presents with   Yeast infection     Yeast infection started coupe days ago on antibiotic for UTI white discharge, odor  Wants STI screening Wants flu vaccine     Vaginal Discharge The patient's primary symptoms include vaginal discharge. The patient's pertinent negatives include no genital itching, genital lesions, genital odor, genital rash, missed menses, pelvic pain or vaginal bleeding. This is a new problem. The current episode started in the past 7 days. The problem occurs constantly. The problem has been unchanged. The patient is experiencing no pain. She is not pregnant. Associated symptoms include constipation. Pertinent negatives include no discolored urine, dysuria, fever, frequency, hematuria, painful intercourse, rash or urgency. The vaginal discharge was thick and white. There has been no bleeding. Exacerbated by: after use of oral anti.  Dysuria and blood in urine last week, had virtual visit and keflex , completes course today. Resolved dysuria and hematuria.  Outpatient Medications Prior to Visit  Medication Sig   lisinopril -hydrochlorothiazide  (ZESTORETIC ) 10-12.5 MG tablet Take 1 tablet by mouth daily.   [DISCONTINUED] cephALEXin  (KEFLEX ) 500 MG capsule Take 1 capsule (500 mg total) by mouth 2 (two) times daily for 7 days.   No facility-administered medications prior to visit.   Reviewed past medical and social history.  Review of Systems  Constitutional:  Negative for fever.  Gastrointestinal:  Positive for constipation.  Genitourinary:  Positive for vaginal discharge. Negative for dysuria, frequency, hematuria, missed menses, pelvic pain and urgency.  Skin:  Negative for rash.   Per HPI     Objective:    Physical Exam Vitals and nursing note reviewed.  Constitutional:      General: She is not in acute  distress. Genitourinary:    Comments: Opted to self swab Neurological:     Mental Status: She is alert and oriented to person, place, and time.    BP (!) 142/84 (BP Location: Left Arm, Patient Position: Sitting, Cuff Size: Large)   Pulse 91   Temp 97.9 F (36.6 C)   Ht 5' 3 (1.6 m)   Wt 277 lb 0.6 oz (125.7 kg)   LMP 11/04/2012   SpO2 97%   BMI 49.08 kg/m    Results for orders placed or performed in visit on 03/11/24  POCT Urinalysis Dipstick (Automated)  Result Value Ref Range   Color, UA Yellow    Clarity, UA Clear    Glucose, UA Negative Negative   Bilirubin, UA 1+    Ketones, UA Negative    Spec Grav, UA >=1.030 (A) 1.010 - 1.025   Blood, UA 1+    pH, UA 6.0 5.0 - 8.0   Protein, UA Positive (A) Negative   Urobilinogen, UA negative (A) 0.2 or 1.0 E.U./dL   Nitrite, UA Negative    Leukocytes, UA Negative Negative       Assessment & Plan:   Problem List Items Addressed This Visit   None Visit Diagnoses       Vaginal discharge    -  Primary   Relevant Orders   POCT Urinalysis Dipstick (Automated) (Completed)   Cervicovaginal ancillary only( [place])     Need for influenza vaccination       Relevant Orders   Flu vaccine trivalent PF, 6mos and older(Flulaval,Afluria,Fluarix,Fluzone) (Completed)     Abnormal urine odor  Relevant Orders   Urine Culture      No orders of the defined types were placed in this encounter.  Return if symptoms worsen or fail to improve.    Roselie Mood, NP

## 2024-03-12 ENCOUNTER — Ambulatory Visit (HOSPITAL_COMMUNITY)
Admission: EM | Admit: 2024-03-12 | Discharge: 2024-03-12 | Disposition: A | Discharge status: Home / Self Care | Attending: Internal Medicine | Admitting: Internal Medicine

## 2024-03-12 ENCOUNTER — Ambulatory Visit: Payer: Self-pay

## 2024-03-12 ENCOUNTER — Encounter (HOSPITAL_COMMUNITY): Payer: Self-pay

## 2024-03-12 DIAGNOSIS — A599 Trichomoniasis, unspecified: Secondary | ICD-10-CM

## 2024-03-12 DIAGNOSIS — B9689 Other specified bacterial agents as the cause of diseases classified elsewhere: Secondary | ICD-10-CM

## 2024-03-12 DIAGNOSIS — N939 Abnormal uterine and vaginal bleeding, unspecified: Secondary | ICD-10-CM | POA: Diagnosis not present

## 2024-03-12 DIAGNOSIS — N760 Acute vaginitis: Secondary | ICD-10-CM

## 2024-03-12 LAB — CBC
HCT: 42.2 % (ref 36.0–46.0)
Hemoglobin: 13.6 g/dL (ref 12.0–15.0)
MCH: 28.2 pg (ref 26.0–34.0)
MCHC: 32.2 g/dL (ref 30.0–36.0)
MCV: 87.6 fL (ref 80.0–100.0)
Platelets: 531 K/uL — ABNORMAL HIGH (ref 150–400)
RBC: 4.82 MIL/uL (ref 3.87–5.11)
RDW: 13.6 % (ref 11.5–15.5)
WBC: 13.3 K/uL — ABNORMAL HIGH (ref 4.0–10.5)
nRBC: 0 % (ref 0.0–0.2)

## 2024-03-12 LAB — POCT URINALYSIS DIP (MANUAL ENTRY)
Bilirubin, UA: NEGATIVE
Glucose, UA: NEGATIVE mg/dL
Leukocytes, UA: NEGATIVE
Nitrite, UA: NEGATIVE
Protein Ur, POC: 30 mg/dL — AB
Spec Grav, UA: 1.025 (ref 1.010–1.025)
Urobilinogen, UA: 0.2 U/dL
pH, UA: 6.5 (ref 5.0–8.0)

## 2024-03-12 LAB — URINE CULTURE
MICRO NUMBER:: 17108651
SPECIMEN QUALITY:: ADEQUATE

## 2024-03-12 MED ORDER — METRONIDAZOLE 500 MG PO TABS: 500.0000 mg | ORAL_TABLET | Freq: Two times a day (BID) | ORAL | 0 refills | Status: DC

## 2024-03-12 NOTE — Discharge Instructions (Addendum)
 Urinalysis done today is not consistent with an infectious process now.  Possibly inadequately treated trichomonas and bacterial vaginitis causing vaginal bleeding.  We will treat with metronidazole  twice a day for 7 days.  However given the bleeding we will also order a complete blood count.  These results will take about 24 hours to finalize.  If there is any significant abnormalities we will contact you.  We also highly recommend following up with your primary care or gynecology due to the vaginal bleeding.  Monitor your symptoms.  If you develop worsening abdominal pain, worsening bleeding, fevers, dizziness, lightheadedness then recommend going to the emergency room immediately for further evaluation.

## 2024-03-12 NOTE — ED Provider Notes (Signed)
 [facility]    CSN: [id number] Arrival date & time: 03/12/24  1817      History   Chief Complaint Chief Complaint  Patient presents with   Vaginal Bleeding   STD treament    HPI April Wolfe is a 43 y.o. female.   43 year old female presents urgent care with complaints of vaginal bleeding.  She reports that she was recently treated for urinary tract infection after having dysuria.  She did a e-visit and was prescribed Keflex .  She felt it was a urinary tract infection due to improvement in symptoms with taking Azo.  She then began having some vaginal discharge and some bloody tinge and felt she may have a vaginal yeast infection as there was some associated itching as well.  She was seen yesterday and had a urine testing done as well as cytology.  She saw that her cytology was positive for bacterial vaginitis as well as trichomonas.  She reports that she has not had intercourse since August when she was treated for trichomonas but then remembered that she did have a vacation as she was on the antibiotics and did drink a fair amount of alcohol and is now wondering if she did not have complete clearance.  She was brought in today as the vaginal discharge that she has been having has now turned vaginal bleeding when she goes to urinate.  She was concerned as she has had a hysterectomy and has not had bleeding like this since then.  Her hysterectomy was in 2014.  She denies any severe abdominal pain, fevers, chills.  Her urinary symptoms have resolved.   Vaginal Bleeding Associated symptoms: no abdominal pain, no back pain, no dysuria and no fever     Past Medical History:  Diagnosis Date   Chlamydia 04/16/12   DUB (dysfunctional uterine bleeding)    Epidermolysis bullosa simplex    Fibroids    Gonorrhea 04/16/12   No pertinent past medical history    Obesity     Patient Active Problem List   Diagnosis Date Noted   Pure hypercholesterolemia 01/24/2023   Dependent  edema 01/24/2023   Thrombocytosis 01/24/2023   Impaired fasting glucose 08/13/2022   Former tobacco use 06/21/2022   Morbid obesity (HCC) 06/21/2022   HTN (hypertension), benign 06/21/2022   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   Rash and nonspecific skin eruption 10/05/2016   Anemia 11/13/2012   Leiomyoma of uterus, unspecified 10/03/2011    Past Surgical History:  Procedure Laterality Date   BILATERAL SALPINGECTOMY Bilateral 11/13/2012   Procedure: BILATERAL SALPINGECTOMY;  Surgeon: Harland JAYSON Birkenhead, MD;  Location: [facility];  Service: Gynecology;  Laterality: Bilateral;   CYSTOSCOPY N/A 11/13/2012   Procedure: CYSTOSCOPY;  Surgeon: Harland JAYSON Birkenhead, MD;  Location: [facility];  Service: Gynecology;  Laterality: N/A;   ROBOTIC ASSISTED TOTAL HYSTERECTOMY N/A 11/13/2012   Procedure: ROBOTIC ASSISTED TOTAL HYSTERECTOMY;  Surgeon: Harland JAYSON Birkenhead, MD;  Location: [facility];  Service: Gynecology;  Laterality: N/A;   TONSILLECTOMY      OB History     Gravida  3   Para  2   Term  1   Preterm  1   AB  1   Living  2      SAB  1   IAB  0   Ectopic  0   Multiple  0   Live Births               Home Medications    Prior  to Admission medications   Medication Sig Start Date End Date Taking? Authorizing Provider  metroNIDAZOLE  (FLAGYL ) 500 MG tablet Take 1 tablet (500 mg total) by mouth 2 (two) times daily. 03/12/24  Yes Eniola Cerullo A, PA-C  lisinopril -hydrochlorothiazide  (ZESTORETIC ) 10-12.5 MG tablet Take 1 tablet by mouth daily. 08/28/23   Nche, Roselie Rockford, NP    Family History Family History  Problem Relation Age of Onset   Hypertension Mother    Diabetes Mother    Hypertension Father    Hypertension Brother    Sarcoidosis Brother    Kidney disease Brother    Hypertension Brother    Diabetes Brother    Breast cancer Neg Hx     Social History Social History   Tobacco Use   Smoking status: Every Day    Current packs/day: 0.00    Average packs/day: 0.3 packs/day  for 28.0 years (7.0 ttl pk-yrs)    Types: Cigarettes    Start date: 07/11/1992    Last attempt to quit: 07/11/2020    Years since quitting: 3.6    Passive exposure: Never   Smokeless tobacco: Never  Vaping Use   Vaping status: Never Used  Substance Use Topics   Alcohol use: Yes    Alcohol/week: 1.0 standard drink of alcohol    Types: 1 Standard drinks or equivalent per week    Comment: socially   Drug use: No     Allergies   Patient has no known allergies.   Review of Systems Review of Systems  Constitutional:  Negative for chills and fever.  HENT:  Negative for ear pain and sore throat.   Eyes:  Negative for pain and visual disturbance.  Respiratory:  Negative for cough and shortness of breath.   Cardiovascular:  Negative for chest pain and palpitations.  Gastrointestinal:  Negative for abdominal pain and vomiting.  Genitourinary:  Positive for vaginal bleeding. Negative for dysuria and hematuria.  Musculoskeletal:  Negative for arthralgias and back pain.  Skin:  Negative for color change and rash.  Neurological:  Negative for seizures and syncope.  All other systems reviewed and are negative.    Physical Exam Triage Vital Signs ED Triage Vitals [03/12/24 1924]  Encounter Vitals Group     BP (!) 131/98     Girls Systolic BP Percentile      Girls Diastolic BP Percentile      Boys Systolic BP Percentile      Boys Diastolic BP Percentile      Pulse Rate 87     Resp 18     Temp 98.3 F (36.8 C)     Temp Source Oral     SpO2 98 %     Weight      Height      Head Circumference      Peak Flow      Pain Score 5     Pain Loc      Pain Education      Exclude from Growth Chart    No data found.  Updated Vital Signs BP (!) 131/98 (BP Location: Right Arm)   Pulse 87   Temp 98.3 F (36.8 C) (Oral)   Resp 18   LMP 11/04/2012   SpO2 98%   Visual Acuity Right Eye Distance:   Left Eye Distance:   Bilateral Distance:    Right Eye Near:   Left Eye Near:     Bilateral Near:     Physical Exam Vitals and nursing note reviewed.  Constitutional:      General: She is not in acute distress.    Appearance: She is well-developed.  HENT:     Head: Normocephalic and atraumatic.  Eyes:     Conjunctiva/sclera: Conjunctivae normal.  Cardiovascular:     Rate and Rhythm: Normal rate and regular rhythm.     Heart sounds: No murmur heard. Pulmonary:     Effort: Pulmonary effort is normal. No respiratory distress.     Breath sounds: Normal breath sounds.  Abdominal:     Palpations: Abdomen is soft.     Tenderness: There is abdominal tenderness (Very mild generalized).  Musculoskeletal:        General: No swelling.     Cervical back: Neck supple.  Skin:    General: Skin is warm and dry.     Capillary Refill: Capillary refill takes less than 2 seconds.  Neurological:     Mental Status: She is alert.  Psychiatric:        Mood and Affect: Mood normal.      UC Treatments / Results  Labs (all labs ordered are listed, but only abnormal results are displayed) Labs Reviewed  POCT URINALYSIS DIP (MANUAL ENTRY) - Abnormal; Notable for the following components:      Result Value   Clarity, UA cloudy (*)    Ketones, POC UA trace (5) (*)    Blood, UA large (*)    Protein Ur, POC =30 (*)    All other components within normal limits  CBC    EKG   Radiology No results found.  Procedures Procedures (including critical care time)  Medications Ordered in UC Medications - No data to display  Initial Impression / Assessment and Plan / UC Course  I have reviewed the triage vital signs and the nursing notes.  Pertinent labs & imaging results that were available during my care of the patient were reviewed by me and considered in my medical decision making (see chart for details).     Vaginal bleeding - Plan: POC urinalysis dipstick, POC urinalysis dipstick  Bacterial vaginitis  Trichomoniasis   Urinalysis done today is not consistent with  an infectious process.  Possibly inadequately treated trichomonas and bacterial vaginitis causing vaginal bleeding.  We will treat with metronidazole  twice a day for 7 days.  However given the bleeding we will also order a complete blood count.  These results will take about 24 hours to finalize.  If there is any significant abnormalities we will contact you.  We also highly recommend following up with your primary care or gynecology due to the vaginal bleeding.  Monitor your symptoms.  If you develop worsening abdominal pain, worsening bleeding, fevers, dizziness, lightheadedness then recommend going to the emergency room immediately for further evaluation.  Final Clinical Impressions(s) / UC Diagnoses   Final diagnoses:  Vaginal bleeding  Bacterial vaginitis  Trichomoniasis     Discharge Instructions      Urinalysis done today is not consistent with an infectious process now.  Possibly inadequately treated trichomonas and bacterial vaginitis causing vaginal bleeding.  We will treat with metronidazole  twice a day for 7 days.  However given the bleeding we will also order a complete blood count.  These results will take about 24 hours to finalize.  If there is any significant abnormalities we will contact you.  We also highly recommend following up with your primary care or gynecology due to the vaginal bleeding.  Monitor your symptoms.  If you develop worsening abdominal  pain, worsening bleeding, fevers, dizziness, lightheadedness then recommend going to the emergency room immediately for further evaluation.     ED Prescriptions     Medication Sig Dispense Auth. Provider   metroNIDAZOLE  (FLAGYL ) 500 MG tablet Take 1 tablet (500 mg total) by mouth 2 (two) times daily. 14 tablet Teresa Almarie LABOR, PA-C      PDMP not reviewed this encounter.   Teresa Almarie LABOR, [place] 03/12/24 2015

## 2024-03-12 NOTE — Telephone Encounter (Signed)
 FYI Only or Action Required?: Action required by provider: clinical question for provider, lab or test result follow-up needed, and see 'reason for dispo' notes.  Patient was last seen in primary care on 03/11/2024 by Nche, Roselie Rockford, NP.  Called Nurse Triage reporting Advice Only, Results, and Vaginal Bleeding.  Symptoms began several months ago.  Interventions attempted: Prescription medications: metroNIDAZOLE  (FLAGYL ).  Symptoms are: unchanged.  Triage Disposition: Call PCP Now  Patient/caregiver understands and will follow disposition?: No, wishes to speak with PCP         Copied from CRM #[id number]. Topic: Clinical - Medical Advice >> Mar 12, 2024  9:18 AM Ahlexyia S wrote: Reason for CRM: Pt called in needing to speak to a nurse about her recent diagnosis. Warm transferred to nurse triage. Reason for Disposition  [1] Follow-up call from patient regarding patient's clinical status AND [2] information urgent    Pt concerned that results are still positive, despite finishing treatment and no sexual encounters.  Triager will forward encounter for Roselie, NP 's office to review and advise. Patient verbalized understanding and is expecting call back from office for next steps.  Answer Assessment - Initial Assessment Questions 1. REASON FOR CALL: What is the main reason for your call? or How can I best help you?     Calling with questions re: results in MyChart from yesterday's PCP visit. 2. SYMPTOMS : Do you have any symptoms?      Vaginal bleeding when wiping. Reports tissue is pink 3. OTHER QUESTIONS: Do you have any other questions?     Pt reports recent UC visit with positive trich/BV in August and has since completed treatment (metroNIDAZOLE  (FLAGYL )). Pt concerned that results are still positive, despite finishing treatment and no sexual encounters.  Triager will forward encounter for Roselie, NP 's office to review and advise. Patient verbalized  understanding and is expecting call back from office for next steps.  Protocols used: Information Only Call - No Triage-A-AH, PCP Call - No Triage-A-AH

## 2024-03-12 NOTE — ED Triage Notes (Signed)
 Pt c/o heavy vaginal bleeding every time she urinates today. States saw her PCP yesterday and states saw on Mychart she was positive for BV and Trich. States was tx'd for BV and Trich in August and hasn't had intercourse since the treatment. States has had a hysterectomy.

## 2024-03-12 NOTE — Telephone Encounter (Signed)
 FYI Only or Action Required?: FYI only for provider.  Patient was last seen in primary care on 03/11/2024 by Nche, Roselie Rockford, NP.  Called Nurse Triage reporting Advice Only, Results, and Vaginal Bleeding.  Symptoms began several months ago.  Interventions attempted: Prescription medications: completed STI tx.  Symptoms are: unchanged.  Triage Disposition: See PCP Within 2 Weeks, Call PCP Now  Patient/caregiver understands and will follow disposition?: No, wishes to speak with PCP          Copied from CRM #[id number]. Topic: General - Other >> Mar 12, 2024  4:13 PM Alfonso HERO wrote: Reason for CRM: patient calling back because she hasn't heard back in regards to her triage call this morning. I am transferring her to the e2c2 nurse to take over the call. Reason for Disposition  Bleeding or spotting occurs after hysterectomy    Pt seems extremely concerned about recent + results so triager advised UC for further evaluation/tx.  Triager did schedule appt based of dispo so that she can F/U with PCP.  Answer Assessment - Initial Assessment Questions Pt calling back for concern for vaginal bleeding every time she uses restroom.  Triager called PCP CAL to see if message from AM encounter has been reviewed. Spoke to [place] and was advised UC.  Answer Assessment - Initial Assessment Questions 1. BLEEDING SEVERITY: Describe the bleeding that you are having. How much bleeding is there?      Pink blood noted every time she wipes 2. ONSET: When did the bleeding begin? Is it continuing now?     today 3. MENSTRUAL PERIOD: When was the last normal menstrual period? How is this different than your period?     *No Answer* 4. REGULARITY: How regular are your periods?     Hx of hysterectomy 5. ABDOMEN PAIN: Do you have any pain? How bad is the pain?  (e.g., Scale 0-10; none, mild, moderate, or severe)     denies 6. PREGNANCY: Is there any chance you are pregnant? When  was your last menstrual period?     N/a 7. BREASTFEEDING: Are you breastfeeding?     N/a 8. HORMONE MEDICINES: Are you taking any hormone medicines, prescription or over-the-counter? (e.g., birth control pills, estrogen)     denies 9. BLOOD THINNER MEDICINES: Do you take any blood thinners? (e.g., Coumadin / warfarin, Pradaxa / dabigatran, aspirin)     denies 10. CAUSE: What do you think is causing the bleeding? (e.g., recent gyn surgery, recent gyn procedure; known bleeding disorder, cervical cancer, polycystic ovarian disease, fibroids)         Possible STI 11. HEMODYNAMIC STATUS: Are you weak or feeling lightheaded? If Yes, ask: Can you stand and walk normally?        denies 12. OTHER SYMPTOMS: What other symptoms are you having with the bleeding? (e.g., passed tissue, vaginal discharge, fever, menstrual-type cramps)       STI +, itching, menstrual like back cramps  Protocols used: No Contact or Duplicate Contact Call-A-AH, Vaginal Bleeding - Abnormal-A-AH

## 2024-03-15 ENCOUNTER — Ambulatory Visit: Payer: Self-pay | Admitting: Nurse Practitioner

## 2024-03-15 ENCOUNTER — Ambulatory Visit (HOSPITAL_COMMUNITY): Payer: Self-pay

## 2024-03-15 DIAGNOSIS — A5901 Trichomonal vulvovaginitis: Secondary | ICD-10-CM

## 2024-03-15 NOTE — Telephone Encounter (Signed)
 Called patient back and informed her of her lab results patient informed me that she knew already due to her going to the UC/ED on Friday when she woke up to have some vaginal bleeding. Per Roselie patient is to scheduled a lab only appointment once completion of medication. Patient is scheduled for a lab only appointment for 03/19/24 at 8:20 AM. Patient verbalized understanding and all (if any) questions were answered.

## 2024-03-15 NOTE — Telephone Encounter (Signed)
 Copied from CRM #[id number]. Topic: Clinical - Medical Advice >> Mar 12, 2024 12:32 PM Dedra B wrote: Reason for CRM: Pt called to follow up previous request to have her PCP or PCP's nurse call her regarding a trich/BV diagnosis and symptoms. Pt spoke to NT earlier from would like to speak with PCP or someone from her team.

## 2024-03-19 ENCOUNTER — Other Ambulatory Visit (HOSPITAL_COMMUNITY)
Admission: RE | Admit: 2024-03-19 | Discharge: 2024-03-19 | Disposition: A | Source: Ambulatory Visit | Attending: Nurse Practitioner | Admitting: Nurse Practitioner

## 2024-03-19 ENCOUNTER — Other Ambulatory Visit

## 2024-03-19 DIAGNOSIS — A5901 Trichomonal vulvovaginitis: Secondary | ICD-10-CM | POA: Diagnosis not present

## 2024-03-22 ENCOUNTER — Ambulatory Visit: Admitting: Nurse Practitioner

## 2024-03-22 LAB — URINE CYTOLOGY ANCILLARY ONLY
Chlamydia: NEGATIVE
Comment: NEGATIVE
Comment: NEGATIVE
Comment: NORMAL
Neisseria Gonorrhea: NEGATIVE
Trichomonas: POSITIVE — AB

## 2024-03-23 ENCOUNTER — Ambulatory Visit: Admitting: Nurse Practitioner

## 2024-03-23 ENCOUNTER — Ambulatory Visit: Payer: Self-pay | Admitting: Nurse Practitioner

## 2024-03-23 ENCOUNTER — Encounter: Payer: Self-pay | Admitting: Nurse Practitioner

## 2024-03-23 ENCOUNTER — Telehealth: Payer: Self-pay | Admitting: Nurse Practitioner

## 2024-03-23 ENCOUNTER — Other Ambulatory Visit (HOSPITAL_COMMUNITY): Payer: Self-pay

## 2024-03-23 VITALS — BP 130/78 | HR 83 | Temp 97.6°F | Ht 63.0 in | Wt 276.6 lb

## 2024-03-23 DIAGNOSIS — A5901 Trichomonal vulvovaginitis: Secondary | ICD-10-CM | POA: Diagnosis not present

## 2024-03-23 MED ORDER — TINIDAZOLE 500 MG PO TABS
2.0000 g | ORAL_TABLET | Freq: Every day | ORAL | 0 refills | Status: AC
  Filled 2024-03-23 – 2024-03-25 (×2): qty 28, 7d supply, fill #0
  Filled 2024-03-26: qty 12, 3d supply, fill #0
  Filled 2024-03-26: qty 16, 4d supply, fill #0
  Filled 2024-03-26: qty 15, 3d supply, fill #0
  Filled 2024-03-29: qty 12, 3d supply, fill #1
  Filled ????-??-??: fill #0

## 2024-03-23 MED ORDER — TINIDAZOLE 500 MG PO TABS
1000.0000 mg | ORAL_TABLET | Freq: Every day | ORAL | 0 refills | Status: DC
  Filled 2024-03-23: qty 10, 5d supply, fill #0

## 2024-03-23 MED ORDER — BORIC ACID VAGINAL 600 MG VA SUPP: 1.0000 | Freq: Two times a day (BID) | VAGINAL | Status: AC

## 2024-03-23 NOTE — Telephone Encounter (Signed)
 LVM to CB.

## 2024-03-23 NOTE — Patient Instructions (Signed)
 Schedule lab appointment for repeat urine cytology 1week after completion of tindamax.

## 2024-03-23 NOTE — Telephone Encounter (Signed)
 Inform about increase in tindamax dose to 2g daily x 7days. New Prescription sent. Also advise to start Boric acid vaginal suppository 1supp BID x 72month. If this does not resolve infection I will enter referral to infectious disease specialist.

## 2024-03-23 NOTE — Progress Notes (Signed)
   Acute Office Visit  Subjective:    Patient ID: Cyanna Neace, female    DOB: Aug 14, 1980, 43 y.o.   MRN: [id number]  Chief Complaint  Patient presents with   Exposure to STD    Exposure to STD    Patient is in today for persistent positive trichomonas despite completion of metronidazole  2g in August and metronidazole  500mg  BID x 7days 1week ago. Today she reports resolved vaginal bleeding, no pelvic pain, no fever, no dysuria. Denies any sexual activity since August.  Outpatient Medications Prior to Visit  Medication Sig   lisinopril -hydrochlorothiazide  (ZESTORETIC ) 10-12.5 MG tablet Take 1 tablet by mouth daily.   [DISCONTINUED] metroNIDAZOLE  (FLAGYL ) 500 MG tablet Take 1 tablet (500 mg total) by mouth 2 (two) times daily.   No facility-administered medications prior to visit.    Reviewed past medical and social history.  Review of Systems Per HPI     Objective:    Physical Exam Vitals reviewed.  Neurological:     Mental Status: She is alert and oriented to person, place, and time.  Psychiatric:        Mood and Affect: Mood normal.        Behavior: Behavior normal.        Thought Content: Thought content normal.    BP 130/78 (BP Location: Left Arm, Patient Position: Sitting, Cuff Size: Large)   Pulse 83   Temp 97.6 F (36.4 C) (Temporal)   Ht 5' 3 (1.6 m)   Wt 276 lb 9.6 oz (125.5 kg)   LMP 11/04/2012   SpO2 97%   BMI 49.00 kg/m    No results found for any visits on 03/23/24.     Assessment & Plan:   Problem List Items Addressed This Visit   None Visit Diagnoses       Trichomonas vaginitis    -  Primary   Relevant Medications   tinidazole (TINDAMAX) 500 MG tablet   Other Relevant Orders   Urine cytology ancillary only      Meds ordered this encounter  Medications   tinidazole (TINDAMAX) 500 MG tablet    Sig: Take 2 tablets (1,000 mg total) by mouth daily with breakfast for 5 days.    Dispense:  10 tablet    Refill:  0    Supervising  Provider:   BERNETA FALLOW ALFRED [[id number]]   Repeat urine cytology 1week after completion of treatment.  Consider referral to ID if persistent resistance.  Return if symptoms worsen or fail to improve.    Roselie Mood, NP

## 2024-03-23 NOTE — Addendum Note (Signed)
 Addended by: KATHEEN ROSELIE CROME on: 03/23/2024 03:13 PM   Modules accepted: Orders

## 2024-03-24 ENCOUNTER — Other Ambulatory Visit (HOSPITAL_COMMUNITY): Payer: Self-pay

## 2024-03-24 NOTE — Telephone Encounter (Unsigned)
 Copied from CRM [id number]. Topic: General - Other >> Mar 24, 2024 10:00 AM Alfonso HERO wrote: Reason for CRM: patient returning your call. Please call her back or message via mychart.

## 2024-03-24 NOTE — Telephone Encounter (Signed)
 Called and left a voice message asking to give me a call back at the office at [phone]

## 2024-03-24 NOTE — Telephone Encounter (Addendum)
 Called patient's pharmacy and spoke with Kirke and informed her for my call. She informed me that the Boric Acid suppository are in stock there that a box has 30 tablets. I informed her that I will let patient know that the medication is there and she can pick up when able to.   Called patient back informed her that the pharmacy does have the medication. She informed me that the new pharmacy does not use quick pass to pay for the medication and that she had to ask for them to transfer to [employer] in order to use her badge.

## 2024-03-24 NOTE — Telephone Encounter (Signed)
 Called the patient and informed patient that April Wolfe does not recommend that she make her own boric acid and that she can get them over the counter from most pharmacies. I also informed her that I did speak with pharmacy at [facility] and that the pharmacist did inform me that she will call [facility] and call patient to keep you informed of what is going on.

## 2024-03-24 NOTE — Telephone Encounter (Signed)
 Called patient and informed her of the recommendations from Roselie Mood, NP to increase tindamax dose to 2g daily x 7days. New Prescription sent. Also advise to start Boric acid vaginal suppository 1supp BID x 18month. If this does not resolve infection I will enter referral to infectious disease specialist.  Patient informed me that she went to pick up the medications was able to pick the tindamax tablets and not the vaginal suppository due to them not having it. She asked if she could make her own capsules she has the boric acid powder and the capsules to make herself. I informed her to not make the capsules herself let me inform Roselie to review and I will contact her pharmacy to ask about the suppository.

## 2024-03-25 ENCOUNTER — Other Ambulatory Visit (HOSPITAL_COMMUNITY): Payer: Self-pay

## 2024-03-25 ENCOUNTER — Other Ambulatory Visit: Payer: Self-pay

## 2024-03-26 ENCOUNTER — Other Ambulatory Visit: Payer: Self-pay

## 2024-03-26 ENCOUNTER — Other Ambulatory Visit (HOSPITAL_COMMUNITY): Payer: Self-pay

## 2024-03-29 ENCOUNTER — Other Ambulatory Visit (HOSPITAL_COMMUNITY): Payer: Self-pay

## 2024-03-29 ENCOUNTER — Other Ambulatory Visit: Payer: Self-pay

## 2024-03-30 ENCOUNTER — Other Ambulatory Visit: Payer: Self-pay

## 2024-03-30 ENCOUNTER — Other Ambulatory Visit (HOSPITAL_COMMUNITY): Payer: Self-pay

## 2024-03-30 ENCOUNTER — Telehealth: Payer: Self-pay | Admitting: Nurse Practitioner

## 2024-04-01 NOTE — Telephone Encounter (Signed)
 10/24/2023 same day cancel 12/23/2023 no show - sent final warning 03/22/2024 same day cancel  Pt was seen 10/28. No future appointments have been scheduled. Do you want to proceed with dismissal?

## 2024-04-05 ENCOUNTER — Encounter: Payer: Self-pay | Admitting: Nurse Practitioner

## 2024-04-05 NOTE — Telephone Encounter (Signed)
 Sent final warning letter

## 2024-04-23 ENCOUNTER — Other Ambulatory Visit (HOSPITAL_COMMUNITY): Payer: Self-pay

## 2024-05-05 ENCOUNTER — Other Ambulatory Visit (HOSPITAL_COMMUNITY): Payer: Self-pay
